# Patient Record
Sex: Male | Born: 1935 | Race: White | Hispanic: No | Marital: Married | Smoking: Former smoker
Health system: Southern US, Community
[De-identification: ages and names within clinical notes are randomized; demographics above are authoritative.]

## PROBLEM LIST (undated history)

## (undated) DIAGNOSIS — Z720 Tobacco use: Secondary | ICD-10-CM

## (undated) DIAGNOSIS — M1990 Unspecified osteoarthritis, unspecified site: Secondary | ICD-10-CM

## (undated) DIAGNOSIS — I509 Heart failure, unspecified: Secondary | ICD-10-CM

## (undated) DIAGNOSIS — F1010 Alcohol abuse, uncomplicated: Secondary | ICD-10-CM

## (undated) DIAGNOSIS — I2510 Atherosclerotic heart disease of native coronary artery without angina pectoris: Secondary | ICD-10-CM

## (undated) DIAGNOSIS — I10 Essential (primary) hypertension: Secondary | ICD-10-CM

## (undated) DIAGNOSIS — J449 Chronic obstructive pulmonary disease, unspecified: Secondary | ICD-10-CM

## (undated) DIAGNOSIS — I219 Acute myocardial infarction, unspecified: Secondary | ICD-10-CM

## (undated) HISTORY — DX: Tobacco use: Z72.0

## (undated) HISTORY — DX: Alcohol abuse, uncomplicated: F10.10

## (undated) HISTORY — PX: BACK SURGERY: SHX140

## (undated) HISTORY — DX: Acute myocardial infarction, unspecified: I21.9

## (undated) HISTORY — PX: APPENDECTOMY: SHX54

---

## 2004-03-23 DIAGNOSIS — E785 Hyperlipidemia, unspecified: Secondary | ICD-10-CM | POA: Insufficient documentation

## 2004-03-23 DIAGNOSIS — E669 Obesity, unspecified: Secondary | ICD-10-CM | POA: Insufficient documentation

## 2004-03-23 DIAGNOSIS — M5412 Radiculopathy, cervical region: Secondary | ICD-10-CM | POA: Insufficient documentation

## 2015-10-09 DIAGNOSIS — G4700 Insomnia, unspecified: Secondary | ICD-10-CM | POA: Insufficient documentation

## 2016-11-16 DIAGNOSIS — R0600 Dyspnea, unspecified: Secondary | ICD-10-CM | POA: Insufficient documentation

## 2016-12-22 DIAGNOSIS — R079 Chest pain, unspecified: Secondary | ICD-10-CM | POA: Insufficient documentation

## 2016-12-22 DIAGNOSIS — G4733 Obstructive sleep apnea (adult) (pediatric): Secondary | ICD-10-CM | POA: Insufficient documentation

## 2016-12-22 DIAGNOSIS — R29898 Other symptoms and signs involving the musculoskeletal system: Secondary | ICD-10-CM | POA: Insufficient documentation

## 2017-01-27 ENCOUNTER — Emergency Department (HOSPITAL_COMMUNITY)
Admission: EM | Admit: 2017-01-27 | Discharge: 2017-01-28 | Disposition: A | Discharge status: Home / Self Care | Payer: Medicare PPO | Attending: Emergency Medicine | Admitting: Emergency Medicine

## 2017-01-27 ENCOUNTER — Emergency Department (HOSPITAL_COMMUNITY): Payer: Medicare PPO

## 2017-01-27 ENCOUNTER — Encounter (HOSPITAL_COMMUNITY): Payer: Self-pay

## 2017-01-27 DIAGNOSIS — I10 Essential (primary) hypertension: Secondary | ICD-10-CM | POA: Insufficient documentation

## 2017-01-27 DIAGNOSIS — J449 Chronic obstructive pulmonary disease, unspecified: Secondary | ICD-10-CM | POA: Insufficient documentation

## 2017-01-27 DIAGNOSIS — W010XXA Fall on same level from slipping, tripping and stumbling without subsequent striking against object, initial encounter: Secondary | ICD-10-CM | POA: Diagnosis not present

## 2017-01-27 DIAGNOSIS — Z79899 Other long term (current) drug therapy: Secondary | ICD-10-CM | POA: Insufficient documentation

## 2017-01-27 DIAGNOSIS — S43102A Unspecified dislocation of left acromioclavicular joint, initial encounter: Secondary | ICD-10-CM | POA: Insufficient documentation

## 2017-01-27 DIAGNOSIS — Y92002 Bathroom of unspecified non-institutional (private) residence single-family (private) house as the place of occurrence of the external cause: Secondary | ICD-10-CM | POA: Insufficient documentation

## 2017-01-27 DIAGNOSIS — Z87891 Personal history of nicotine dependence: Secondary | ICD-10-CM | POA: Diagnosis not present

## 2017-01-27 DIAGNOSIS — Y939 Activity, unspecified: Secondary | ICD-10-CM | POA: Insufficient documentation

## 2017-01-27 DIAGNOSIS — Y999 Unspecified external cause status: Secondary | ICD-10-CM | POA: Diagnosis not present

## 2017-01-27 DIAGNOSIS — S4992XA Unspecified injury of left shoulder and upper arm, initial encounter: Secondary | ICD-10-CM | POA: Diagnosis present

## 2017-01-27 HISTORY — DX: Unspecified osteoarthritis, unspecified site: M19.90

## 2017-01-27 HISTORY — DX: Chronic obstructive pulmonary disease, unspecified: J44.9

## 2017-01-27 HISTORY — DX: Essential (primary) hypertension: I10

## 2017-01-27 HISTORY — DX: Atherosclerotic heart disease of native coronary artery without angina pectoris: I25.10

## 2017-01-27 MED ORDER — HYDROCODONE-ACETAMINOPHEN 5-325 MG PO TABS
1.0000 | ORAL_TABLET | Freq: Once | ORAL | Status: AC
  Administered 2017-01-27: 1 via ORAL
  Filled 2017-01-27: qty 1

## 2017-01-27 NOTE — ED Triage Notes (Signed)
Pt states he tripped on a rug and fell, landed on his left side.  Pt has pain with palpation and movement to his left shoulder.  Pt denies hitting his head or loc.

## 2017-01-27 NOTE — ED Notes (Signed)
Patient transported to X-ray 

## 2017-01-27 NOTE — ED Notes (Signed)
Patient transported back from X-ray 

## 2017-01-28 MED ORDER — HYDROCODONE-ACETAMINOPHEN 5-325 MG PO TABS: 1.0000 | ORAL_TABLET | ORAL | 0 refills | Status: DC | PRN

## 2017-01-28 NOTE — ED Provider Notes (Signed)
[facility] Provider Note   CSN: [id number] Arrival date & time: 01/27/17  2122     History   Chief Complaint Chief Complaint  Patient presents with  . Fall    shoulder pain    HPI Eddie Mitchell is a 83 y.o. male presenting for evaluation of left shoulder and clavicle pain after falling on his left side against the tub in his bathroom just prior to arrival.  He tripped over a rug, landing against the tub.  He denies head or neck injury or pain and was able to get off the floor without assistance.  His pain is constant and worsened with movement and palpation.  He denies weakness or numbness in his arms and denies cp, sob, pain with deep inspiration, lower back, hip or lower extremity pain.  His past medical history is significant for CAD and is on plavix for this condition.  HPI  Past Medical History:  Diagnosis Date  . Arthritis   . Cardiovascular disease   . COPD (chronic obstructive pulmonary disease) (HCC)   . Hypertension     There are no active problems to display for this patient.   Past Surgical History:  Procedure Laterality Date  . APPENDECTOMY    . BACK SURGERY         Home Medications    Prior to Admission medications   Medication Sig Start Date End Date Taking? Authorizing Provider  diclofenac (VOLTAREN) 50 MG EC tablet TAKE ONE TABLET TWO TIMES A DAY WITH FOOD 01/20/17  Yes Historical Provider, MD  pramipexole (MIRAPEX) 0.5 MG tablet Take by mouth. 12/24/16  Yes Historical Provider, MD  sertraline (ZOLOFT) 100 MG tablet TAKE ONE TABLET DAILY 01/20/17  Yes Historical Provider, MD  tamsulosin (FLOMAX) 0.4 MG CAPS capsule TAKE 2 CAPSULES BY MOUTH EVERY DAY 11/08/16  Yes Historical Provider, MD  HYDROcodone-acetaminophen (NORCO/VICODIN) 5-325 MG tablet Take 1 tablet by mouth every 4 (four) hours as needed. 01/28/17   Burgess Amor, PA-C  HYDROcodone-acetaminophen (NORCO/VICODIN) 5-325 MG tablet Take 1 tablet by mouth every 4 (four) hours as needed. 01/28/17    Burgess Amor, PA-C    Family History No family history on file.  Social History Social History  Substance Use Topics  . Smoking status: Former Games developer  . Smokeless tobacco: Never Used  . Alcohol use No     Allergies   Patient has no known allergies.   Review of Systems Review of Systems  HENT: Negative.   Eyes: Negative for visual disturbance.  Respiratory: Negative.   Cardiovascular: Negative.   Gastrointestinal: Negative.   Musculoskeletal: Positive for arthralgias and joint swelling. Negative for myalgias.  Skin: Negative for wound.  Neurological: Negative for dizziness, syncope, weakness, numbness and headaches.     Physical Exam Updated Vital Signs BP (!) 115/58 (BP Location: Right Arm)   Pulse (!) 59   Temp 98.1 F (36.7 C) (Oral)   Resp 20   Ht  (1.778 m)   Wt 120.7 kg   SpO2 95%   BMI 38.17 kg/m   Physical Exam  Constitutional: He appears well-developed and well-nourished.  HENT:  Head: Normocephalic and atraumatic.  Neck: Normal range of motion and full passive range of motion without pain.  Cardiovascular:  Pulses equal bilaterally  Musculoskeletal: He exhibits tenderness.       Left shoulder: He exhibits bony tenderness and deformity. He exhibits no effusion, no spasm, normal pulse and normal strength.  ttp lateral left clavicle with palpable deformity.  Anterior  shoulder, scapula and left elbow non tender.  Neurological: He is alert. He has normal strength. He displays normal reflexes. No sensory deficit.  Equal grip strength.  Skin: Skin is warm and dry.  Skin intact  Psychiatric: He has a normal mood and affect.     ED Treatments / Results  Labs (all labs ordered are listed, but only abnormal results are displayed) Labs Reviewed - No data to display  EKG  EKG Interpretation None       Radiology Dg Shoulder Left  Result Date: 01/27/2017 CLINICAL DATA:  Trip and fall over a rug, left shoulder pain. EXAM: LEFT SHOULDER - 2+  VIEW COMPARISON:  None. FINDINGS: Acromioclavicular joint widening of at least 9 mm with superior displacement of distal clavicle with respect to the acromion. Widening of the coracoclavicular distance of 3.2 cm. No definite acute fracture, there is spurring of the acromion. Glenohumeral alignment is maintained. IMPRESSION: Acromioclavicular joint separation. Widening of the coracoclavicular distance. Findings consistent with at least type 3 (or higher) AC joint separation. No evidence of acute fracture. Electronically Signed   By: Rubye Oaks M.D.   On: 01/27/2017 22:11    Procedures Procedures (including critical care time)  Medications Ordered in ED Medications  HYDROcodone-acetaminophen (NORCO/VICODIN) 5-325 MG per tablet 1 tablet (1 tablet Oral Given 01/27/17 2330)     Initial Impression / Assessment and Plan / ED Course  I have reviewed the triage vital signs and the nursing notes.  Pertinent labs & imaging results that were available during my care of the patient were reviewed by me and considered in my medical decision making (see chart for details).     Pt with significant AC separation, no other injury. He was placed in a sling with swath. Hydrocodone prescribed. Plan f/u with ortho, referral given.    Pt was seen by Dr. Rubin Payor prior to discharge.  Final Clinical Impressions(s) / ED Diagnoses   Final diagnoses:  AC separation, left, initial encounter    New Prescriptions Discharge Medication List as of 01/28/2017 12:31 AM    START taking these medications   Details  HYDROcodone-acetaminophen (NORCO/VICODIN) 5-325 MG tablet Take 1 tablet by mouth every 4 (four) hours as needed., Starting Fri 01/28/2017, Print         Burgess Amor, PA-C 01/28/17 1741    Benjiman Core, MD 01/29/17 1154

## 2017-01-28 NOTE — Discharge Instructions (Signed)
Wear the shoulder sling at all times except when bathing.  You may take the hydrocodone prescribed for pain relief.  This will make you drowsy - do not drive within 4 hours of taking this medication.

## 2017-01-31 ENCOUNTER — Encounter: Payer: Self-pay | Admitting: Orthopedic Surgery

## 2017-01-31 ENCOUNTER — Ambulatory Visit (INDEPENDENT_AMBULATORY_CARE_PROVIDER_SITE_OTHER): Payer: Medicare PPO | Admitting: Orthopedic Surgery

## 2017-01-31 VITALS — BP 142/58 | HR 75 | Wt 270.0 lb

## 2017-01-31 DIAGNOSIS — S43102A Unspecified dislocation of left acromioclavicular joint, initial encounter: Secondary | ICD-10-CM

## 2017-01-31 MED FILL — Hydrocodone-Acetaminophen Tab 5-325 MG: ORAL | Qty: 6 | Status: AC

## 2017-01-31 NOTE — Progress Notes (Signed)
New patient   Chief Complaint  Patient presents with  . Shoulder Injury    LEFT SHOULDER INJURY, DOI 01/27/17   History 83 year old male fell in his house in in the bathroom injured his left shoulder presented to the ER the 19th 5 days ago complaining of left shoulder pain  Summation he is 83 years old his nondominant left shoulder was injured. He complains of 5 day history of severe pain and deformity and dull aching sensation of the left shoulder pain distal clavicle  HPI  Review of Systems  Constitutional: Negative for weight loss.  Cardiovascular: Negative for chest pain.  Neurological: Positive for tingling. Negative for focal weakness and weakness.       Lower extremity left worse than right chronic   Past Medical History:  Diagnosis Date  . Arthritis   . Cardiovascular disease   . COPD (chronic obstructive pulmonary disease) (HCC)   . Hypertension     BP (!) 142/58   Pulse 75   Wt 270 lb (122.5 kg)   BMI 38.74 kg/m   Okay constitutional symptoms continued exam shows normal development and nutrition grooming and hygiene he does have deformity left distal clavicle with prominence  Referral vascular system is observe no swelling or varicose veins in his upper extremities and his upper extremities had normal radial pulses in each arm. His left upper extremity was warm to touch there was no significant edema  His left axilla and supraclavicular region have no palpable lymph nodes  He was able to ambulate normally  His left shoulder showed prominence of the distal clavicle ecchymosis the skin over the left shoulder and arm with painful passive range of motion in the left shoulder limited by pain to 15 globally. The distal clavicle was unstable to shoulder joint was reduced by shown on x-ray was not tested for subluxation muscle tone was normal  Coordination tests were deferred as were deep tendon reflexes in the upper extremity normal sensation was noted in the left and  right hand he was oriented to time person place mood and affect were normal  The x-ray was done at the hospital it shows a type III distal clavicle separation  Encounter Diagnosis  Name Primary?  . Closed dislocation of left acromioclavicular joint, initial encounter Yes     Recommended treatment for 83 year old male nondominant with no skin threatening his sling and comfort measures  Return 1 week recheck for any neurovascular compromise and then start exercises

## 2017-01-31 NOTE — Patient Instructions (Signed)
AC JOINT SEPARATION TYPE 3

## 2017-02-07 ENCOUNTER — Encounter: Payer: Self-pay | Admitting: Orthopedic Surgery

## 2017-02-07 ENCOUNTER — Ambulatory Visit (INDEPENDENT_AMBULATORY_CARE_PROVIDER_SITE_OTHER): Payer: Medicare PPO | Admitting: Orthopedic Surgery

## 2017-02-07 DIAGNOSIS — S43102A Unspecified dislocation of left acromioclavicular joint, initial encounter: Secondary | ICD-10-CM | POA: Diagnosis not present

## 2017-02-07 NOTE — Progress Notes (Signed)
Progress Note   Patient ID: Eddie Mitchell, male   DOB: [birth year], 83 y.o.   MRN: [id number]  Chief Complaint  Patient presents with  . Follow-up    left ac joint separation, DOI 01/27/17    HPI Eddie Mitchell is a 83 y.o. male.   HPI 83 year old male type 3+ acromio clavicular separation. Complains of periscapular pain Review of Systems Review of Systems Normal neuro  Denies fever  Examination There were no vitals taken for this visit.  Gen. appearance the patient's appearance is normal with normal grooming and  hygiene The patient is oriented to person place and time Mood and affect are normal   Ortho Exam Motor exam 5/5 manual muscle testing , right hand with normal nerve function  Skin is normal (no rash or erythema)    Medical decision-making Diagnosis, Data, Plan (risk)  Encounter Diagnosis  Name Primary?  . Closed dislocation of left acromioclavicular joint, initial encounter Yes    X-ray left clavicle in 2 weeks continue sling  After x-ray in 2 weeks start exercises at home  Fuller Canada, MD 02/07/2017 3:48 PM

## 2017-02-21 ENCOUNTER — Ambulatory Visit (INDEPENDENT_AMBULATORY_CARE_PROVIDER_SITE_OTHER): Payer: Medicare PPO

## 2017-02-21 ENCOUNTER — Encounter: Payer: Self-pay | Admitting: Orthopedic Surgery

## 2017-02-21 ENCOUNTER — Ambulatory Visit (INDEPENDENT_AMBULATORY_CARE_PROVIDER_SITE_OTHER): Payer: Medicare PPO | Admitting: Orthopedic Surgery

## 2017-02-21 DIAGNOSIS — S43102D Unspecified dislocation of left acromioclavicular joint, subsequent encounter: Secondary | ICD-10-CM

## 2017-02-21 NOTE — Progress Notes (Signed)
Progress Note   Patient ID: Eddie Mitchell, male   DOB: Mar 22, 1934, 83 y.o.   MRN: [id number]  Chief Complaint  Patient presents with  . Follow-up    Recheck on left clavicle fracture, DOI 01-27-17.    HPI Eddie ArnHarold Mitchell is a 83 y.o. male.   HPI  83 year old male type III before meals separation left shoulder here for x-rays. He says he is doing much better and is like a new man Review of Systems Review of Systems Normal neuro  Denies fever  Examination There were no vitals taken for this visit.  Gen. appearance the patient's appearance is normal with normal grooming and  hygiene The patient is oriented to person place and time Mood and affect are normal   Ortho Exam Stability tests are normal  Motor exam 5/5 manual muscle testing , no atrophy  Skin is normal (no rash or erythema) prominent left distal clavicle with some motion noted at the separation  Neurovascular exam remains intact Medical decision-making Diagnosis, Data, Plan (risk)  Encounter Diagnosis  Name Primary?  . Closed dislocation of left acromioclavicular joint, subsequent encounter Yes    The x-ray shows a type III distal clavicle Acromioclavicular separation  Recommend exercises and recheck 6 weeks   Fuller CanadaStanley Leonard Feigel, MD 02/21/2017 3:31 PM

## 2017-02-21 NOTE — Patient Instructions (Addendum)

## 2017-04-04 ENCOUNTER — Encounter: Payer: Self-pay | Admitting: Orthopedic Surgery

## 2017-04-04 ENCOUNTER — Ambulatory Visit (INDEPENDENT_AMBULATORY_CARE_PROVIDER_SITE_OTHER): Payer: Medicare PPO | Admitting: Orthopedic Surgery

## 2017-04-04 DIAGNOSIS — S43102D Unspecified dislocation of left acromioclavicular joint, subsequent encounter: Secondary | ICD-10-CM

## 2017-04-04 NOTE — Progress Notes (Signed)
[facility]  Chief Complaint  Patient presents with  . Follow-up    Recheck on left AC seperation, DOI 01-27-17.    Patient complains of some paresthesias in the left upper extremity down to his elbow. He has had chronic paresthesias down to the index and long finger for several years. He notices an increase in tingling in electric-like shock sensation left upper extremity down to but not below the elbow  His distal clavicle is slightly tender to palpation in his abduction is 85 his flexion is 140  Hand normal sensation and pulse  Traction paresthesias are noted. I told him if it becomes constant or goes below the elbow then we need to do further imaging.  Recommend active range of motion exercises follow-up if any progression of the paresthesias

## 2017-06-26 IMAGING — DX DG SHOULDER 2+V*L*
2 series · 2 of 2 positions shown · non-contrast
Comparison: None.

CLINICAL DATA: Trip and fall over a rug, left shoulder pain.

EXAM:
LEFT SHOULDER - 2+ VIEW

[shoulder grashey]
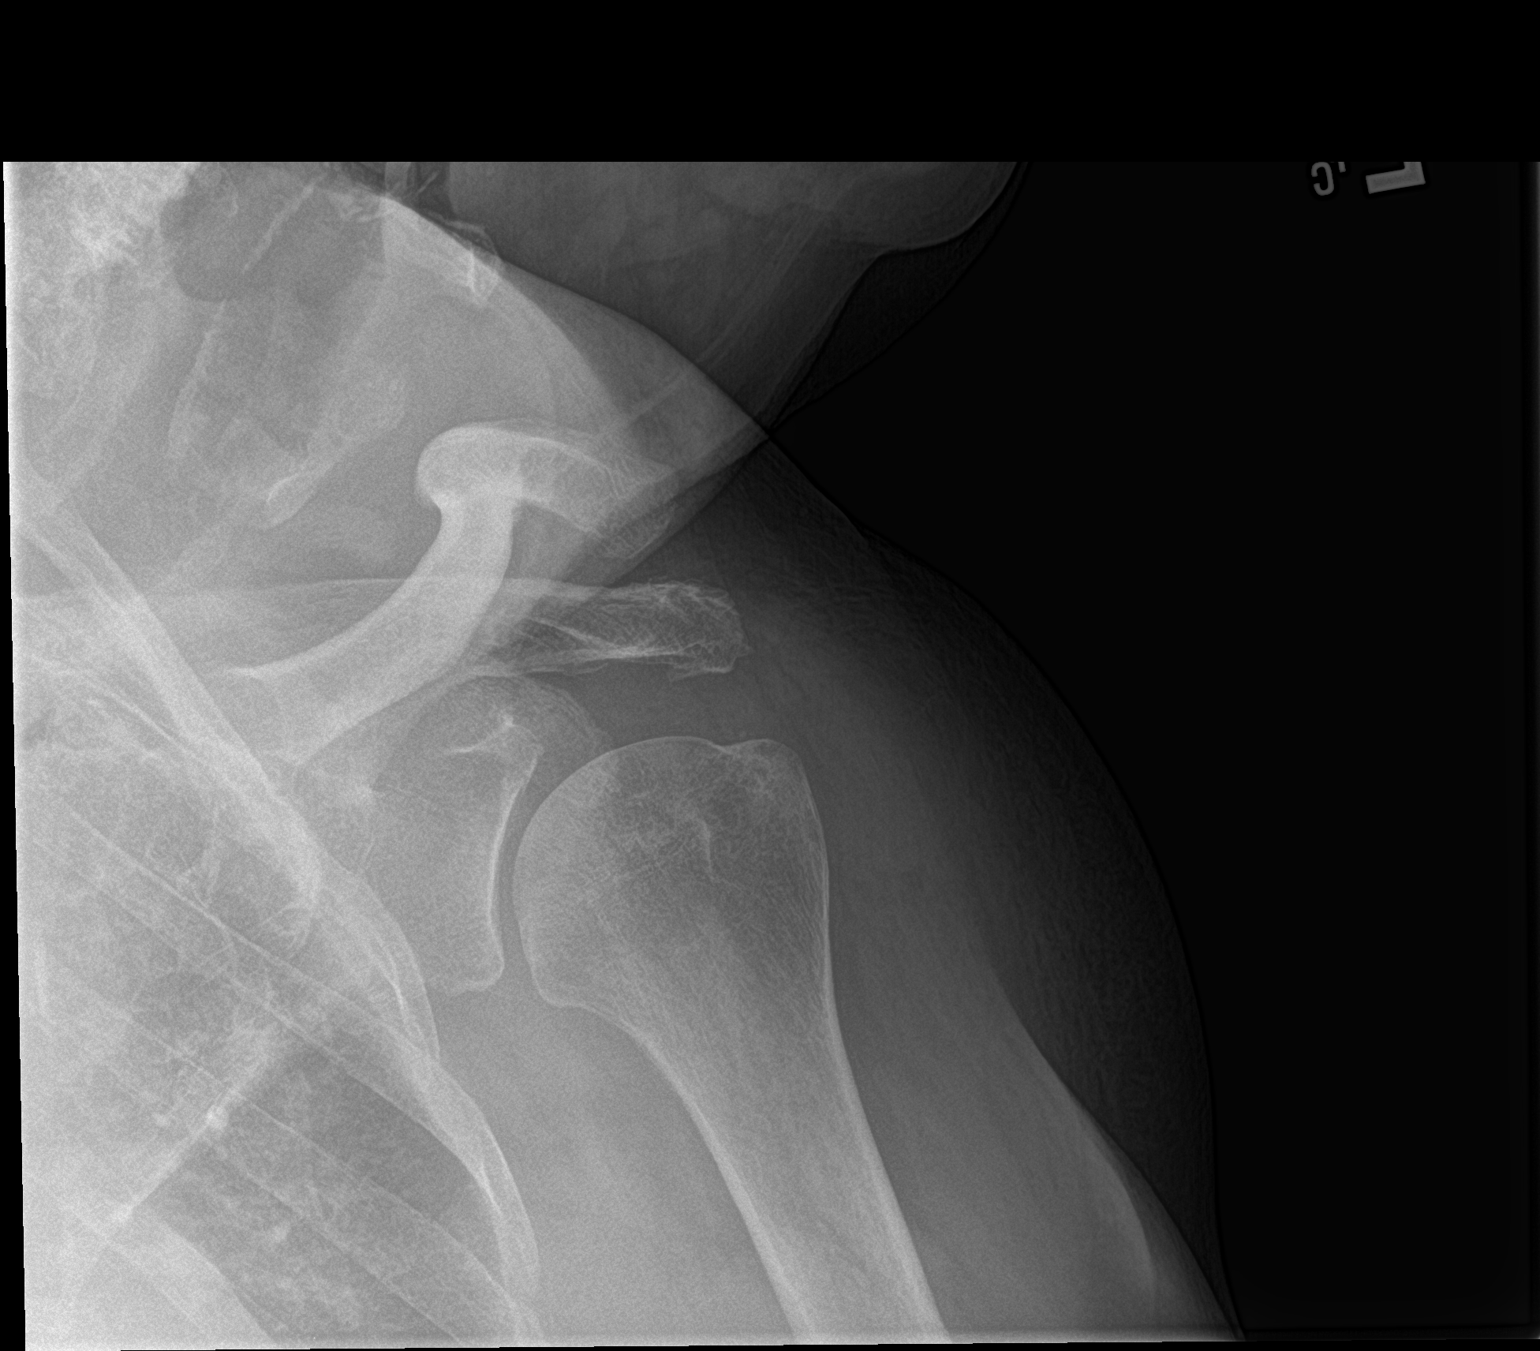

[shoulder y view]
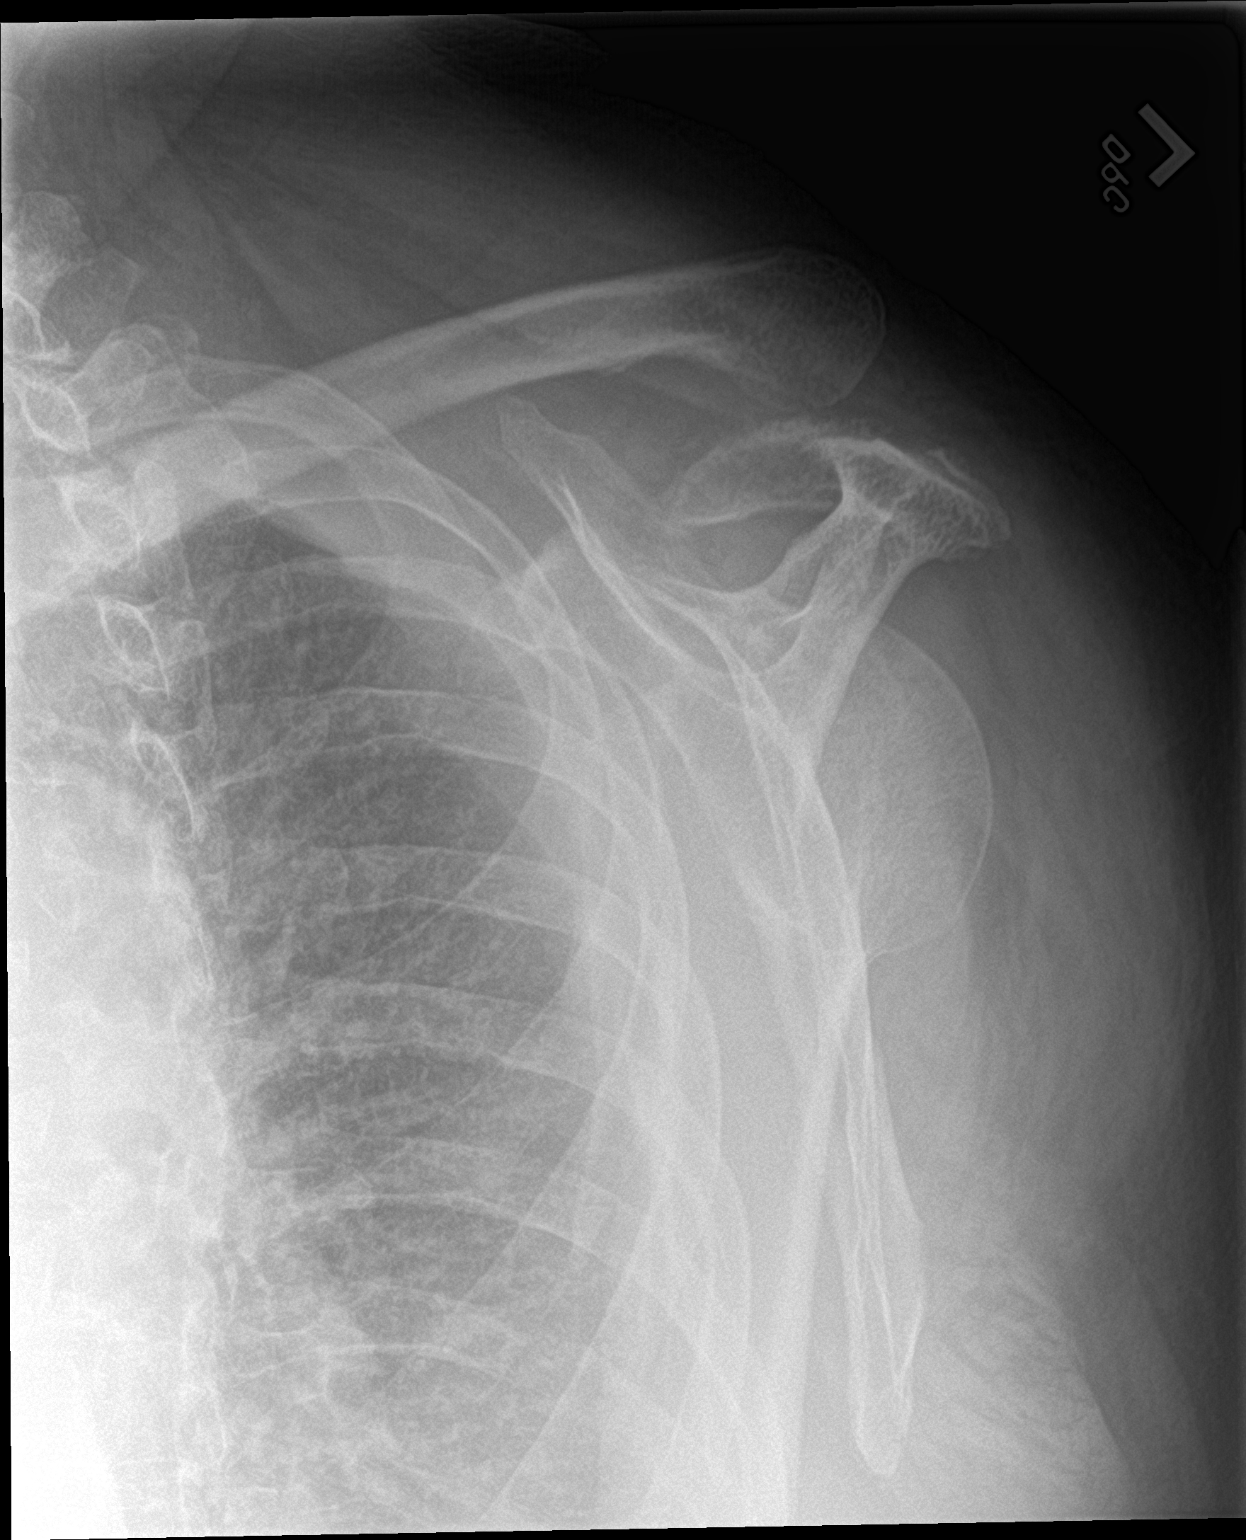

[2 of 2 positions shown; findings below may reference images not displayed]

FINDINGS: Acromioclavicular joint widening of at least 9 mm with superior
displacement of distal clavicle with respect to the acromion.
Widening of the coracoclavicular distance of 3.2 cm. No definite
acute fracture, there is spurring of the acromion. Glenohumeral
alignment is maintained.
IMPRESSION: Acromioclavicular joint separation. Widening of the coracoclavicular
distance. Findings consistent with at least type 3 (or higher) AC
joint separation.

No evidence of acute fracture.

## 2017-10-06 DIAGNOSIS — R609 Edema, unspecified: Secondary | ICD-10-CM | POA: Insufficient documentation

## 2017-10-06 DIAGNOSIS — I4891 Unspecified atrial fibrillation: Secondary | ICD-10-CM | POA: Insufficient documentation

## 2017-10-06 DIAGNOSIS — R600 Localized edema: Secondary | ICD-10-CM | POA: Insufficient documentation

## 2017-10-26 ENCOUNTER — Telehealth: Payer: Self-pay | Admitting: Orthopedic Surgery

## 2017-10-26 NOTE — Telephone Encounter (Signed)
Left message. Dr Romeo AppleHarrison did not order any PT for him. Asked them to call me back if the orders that need signed are old ones, we have not seen patient greater than 6 months.

## 2017-10-26 NOTE — Telephone Encounter (Signed)
[place] health PT [place] not come into [place] with [place] orders from Pulmonology nurse practitioner  Home health wants to know if you will sign off on the Skilled nursing / and lab orders  CBC and CMP   Can you co sign her orders?  [place]CP is in [place] also and can not co sign.

## 2017-10-26 NOTE — Telephone Encounter (Signed)
I have discussed with Dr Romeo AppleHarrison, he can not cosign pulmonology home PT orders. I called home health to advise. Spoke to [facility] / she has voiced understanding.

## 2017-10-26 NOTE — Telephone Encounter (Signed)
UNFORTUNATELY I CANT TAKE ON THAT RISK

## 2017-10-26 NOTE — Telephone Encounter (Signed)
Call received via voice message from [facility] [place] - PH# [phone], with question/request for Dr Romeo AppleHarrison to co-sign on orders? (Does not appear that Dr Romeo AppleHarrison is ordering [facility])   I've left a message in return to relay that I have entered a telephone note, response to follow; I also provided our fax# if they wish to fax their request.

## 2017-10-28 ENCOUNTER — Other Ambulatory Visit: Payer: Self-pay

## 2017-10-28 ENCOUNTER — Ambulatory Visit: Payer: Medicare PPO | Admitting: Family Medicine

## 2017-10-28 ENCOUNTER — Telehealth: Payer: Self-pay | Admitting: Family Medicine

## 2017-10-28 ENCOUNTER — Encounter: Payer: Self-pay | Admitting: Family Medicine

## 2017-10-28 VITALS — BP 142/68 | HR 64 | Temp 98.1°F | Resp 16 | Ht 70.0 in | Wt 280.2 lb

## 2017-10-28 DIAGNOSIS — R600 Localized edema: Secondary | ICD-10-CM

## 2017-10-28 DIAGNOSIS — R0602 Shortness of breath: Secondary | ICD-10-CM | POA: Diagnosis not present

## 2017-10-28 DIAGNOSIS — J440 Chronic obstructive pulmonary disease with acute lower respiratory infection: Secondary | ICD-10-CM | POA: Diagnosis not present

## 2017-10-28 DIAGNOSIS — I480 Paroxysmal atrial fibrillation: Secondary | ICD-10-CM | POA: Diagnosis not present

## 2017-10-28 DIAGNOSIS — Z79899 Other long term (current) drug therapy: Secondary | ICD-10-CM | POA: Diagnosis not present

## 2017-10-28 DIAGNOSIS — G2581 Restless legs syndrome: Secondary | ICD-10-CM

## 2017-10-28 DIAGNOSIS — J209 Acute bronchitis, unspecified: Secondary | ICD-10-CM

## 2017-10-28 LAB — BASIC METABOLIC PANEL
BUN/Creatinine Ratio: 17 (calc) (ref 6–22)
BUN: 21 mg/dL (ref 7–25)
CHLORIDE: 100 mmol/L (ref 98–110)
CO2: 31 mmol/L (ref 20–32)
Calcium: 9.8 mg/dL (ref 8.6–10.3)
Creat: 1.23 mg/dL — ABNORMAL HIGH (ref 0.70–1.11)
GLUCOSE: 106 mg/dL (ref 65–139)
Potassium: 4.4 mmol/L (ref 3.5–5.3)
Sodium: 139 mmol/L (ref 135–146)

## 2017-10-28 MED ORDER — DOXYCYCLINE HYCLATE 100 MG PO TABS
100.0000 mg | ORAL_TABLET | Freq: Two times a day (BID) | ORAL | 0 refills | Status: DC
Start: 2017-10-28 — End: 2017-11-25

## 2017-10-28 MED ORDER — ALBUTEROL SULFATE (2.5 MG/3ML) 0.083% IN NEBU
2.5000 mg | INHALATION_SOLUTION | Freq: Once | RESPIRATORY_TRACT | Status: AC
  Administered 2017-10-28: 2.5 mg via RESPIRATORY_TRACT

## 2017-10-28 MED ORDER — IPRATROPIUM BROMIDE 0.02 % IN SOLN
0.5000 mg | Freq: Once | RESPIRATORY_TRACT | Status: AC
  Administered 2017-10-28: 0.5 mg via RESPIRATORY_TRACT

## 2017-10-28 MED ORDER — PRAMIPEXOLE DIHYDROCHLORIDE 0.5 MG PO TABS: 1.0000 mg | ORAL_TABLET | Freq: Every day | ORAL | 0 refills | Status: DC

## 2017-10-28 NOTE — Telephone Encounter (Signed)
Please call patient's cardiologist, Dr. Catha GosselinBanerjee, and advised that patient was seen in our office on Friday afternoon.  Please advised that he is reportedly coming to their office today at 3 PM for Doppler of his lower extremities.  I am very concerned about his cardiac situation and request that he be seen by his regular cardiologist in the office today if at all possible.  He was very symptomatic with his breathing, or worsened lower extremity edema, and orthopnea at his visit on Friday.  I do believe this is a situation which needs to be addressed by the provider who knows him best.  Please find out if patient can be seen for an appointment and contact patient with this information.

## 2017-10-28 NOTE — Progress Notes (Signed)
Patient ID: Eddie Mitchell, male    DOB: [birth year], 84 y.o.   MRN: [id number]  Chief Complaint  Patient presents with  . Hypertension  . Establish Care    Allergies Patient has no known allergies.  Subjective:   Eddie Mitchell is a 84 y.o. male who presents to [facility] today.  HPI Eddie Mitchell presents today as a new patient visit to establish care.  He presents today with his wife.  They reside in [place], but have received much of their medical care in [place].  He has reportedly been seen by primary care physician in [place], Dr. Tanda Rockers.  In addition, he has been followed by Dr. Leavy Cella and pulmonology and Dr.Benerjee in cardiology.  All of these doctors in [place].  He has however been seen by Dr. Romeo Apple in orthopedics in [place] secondary to a fall in April 2018.  He reports that he had been in his usual state of health until several months ago.  He reports that he developed edema in his lower extremities and dyspnea on exertion and shortness of breath about 2 months ago.  He was seen by cardiology at that time and was found to be in atrial fibrillation.  At that time he was switched from Plavix to Eliquis.  He continued to have worsening swelling in his legs and went back to cardiology.  He was seen by Eddie Mitchell, a nurse practitioner in the cardiology practice on October 24, 2017.  At that time he was taken off his Lasix and placed on Demadex 1 mg p.o. daily.  He has been taking this medication since October 25, 2017.  At that time his cardiologist office told him that he needed home health services to come to his house to check blood work twice a week to make sure that his potassium remained within normal limits.  These home health services were attempted to be set up.  However, patient was told that a physician in [place] could not write for home health services in [place].  Therefore, patient presents today with complaints of swelling  in his legs, dyspnea on exertion, shortness of breath, and a request for home health services twice a week to check blood work.  He reports that he has a history of COPD and tobacco abuse.  He has been prescribed Symbicort to use 2 puffs twice a day.  In addition he has albuterol nebulizer at home.  He reports that when he walks short distances he feels very winded and has trouble breathing.  He reports that over the past 1-2 months he has had increased cough with sputum production.  He reports his cough is yellow-green in color.  He also reports that he has been having to sleep with multiple pillows under him or sleep in a recliner because he can no longer lie flat in his bed.  He denies any chest pain or palpitations.  He reports he was never even able to tell when he was in atrial fibrillation.  He reports he is to have a Doppler scan of his lower legs done on Monday at the cardiologist office to make sure he does not have a deep venous thrombosis.  He has never had a blood clot before.  He reports that since starting the new fluid pill that he has lost some weight and his legs are a little bit better but they are still very swollen.  He reports that prior to 2 months ago  he did not have any baseline swelling in his legs.  He reports that several weeks ago his legs were so swollen that they were weeping fluid.  He has no lesions or open wounds on his legs.  He reports that his energy is low.  His appetite is okay.  Bowel movements are normal.  No abdominal pain.  He denies any fevers, chills, nausea, vomiting, diarrhea.  He reports that he has not been compliant with his Symbicort inhaler.  He does use the albuterol from time to time.  He is not been treated for pneumonia or any recent chest infections.  He has not had a recent chest x-ray.  He reports that he does occasionally cough up sputum which is usually clear but over the past couple months sputum is changed in color and he has had increased sputum  production.    Past Medical History:  Diagnosis Date  . Alcohol abuse   . AMI (acute myocardial infarction) (HCC)    s/p 2 stents. Dr. Mervin HackBenerjee  . Arthritis   . Cardiovascular disease   . COPD (chronic obstructive pulmonary disease) (HCC)   . Hypertension   . Tobacco abuse    quit in 1992; smoked cigarettes for 50 years/ Marlboro Lights, 1/2 ppd or less    Past Surgical History:  Procedure Laterality Date  . APPENDECTOMY    . BACK SURGERY      No family history on file.   Social History   Socioeconomic History  . Marital status: Married    Spouse name: None  . Number of children: None  . Years of education: None  . Highest education level: None  Social Needs  . Financial resource strain: None  . Food insecurity - worry: None  . Food insecurity - inability: None  . Transportation needs - medical: None  . Transportation needs - non-medical: None  Occupational History  . None  Tobacco Use  . Smoking status: Former Games developermoker  . Smokeless tobacco: Never Used  Substance and Sexual Activity  . Alcohol use: No  . Drug use: No  . Sexual activity: Yes  Other Topics Concern  . None  Social History Narrative   Married for 60 year and wife died. Has four children (3 girls and 1 boy).   Married second time for one year.    Eats all foods.   Drive.   Wear seatbelt.   Retired for over 20 years.   Worked at [employer].    Current Outpatient Medications on File Prior to Visit  Medication Sig Dispense Refill  . apixaban (ELIQUIS) 5 MG TABS tablet Eliquis 5 mg tablet  Take 1 tablet twice a day by oral route for 90 days.    Marland Kitchen. aspirin EC 81 MG tablet Take 81 mg by mouth daily.    Marland Kitchen. atorvastatin (LIPITOR) 40 MG tablet Take 40 mg by mouth daily.    . budesonide-formoterol (SYMBICORT) 160-4.5 MCG/ACT inhaler Inhale into the lungs.    . bumetanide (BUMEX) 1 MG tablet bumetanide 1 mg tablet  Take 1 tablet twice a day by oral route for 90 days.    Marland Kitchen. diltiazem (CARTIA XT) 180 MG 24 hr  capsule diltiazem CD 180 mg capsule,extended release 24 hr  Take 1 capsule every day by oral route for 90 days.    Marland Kitchen. lisinopril (PRINIVIL,ZESTRIL) 10 MG tablet Take 10 mg by mouth daily.    . metoprolol tartrate (LOPRESSOR) 25 MG tablet Take 25 mg by mouth 2 (two) times  daily.    . nitroGLYCERIN (NITROSTAT) 0.4 MG SL tablet Place 0.4 mg under the tongue every 5 (five) minutes as needed for chest pain.     No current facility-administered medications on file prior to visit.     Review of Systems  Constitutional: Positive for fatigue and unexpected weight change. Negative for chills and diaphoresis.       Reports that he has gained over 10 pounds over the past month.  HENT: Negative for facial swelling, nosebleeds, trouble swallowing and voice change.   Respiratory: Positive for cough, chest tightness, shortness of breath and wheezing. Negative for stridor.   Cardiovascular: Positive for leg swelling. Negative for chest pain and palpitations.  Gastrointestinal: Negative for abdominal distention, abdominal pain, diarrhea and vomiting.  Endocrine: Negative for polyphagia and polyuria.  Genitourinary: Negative for decreased urine volume, dysuria, hematuria and urgency.  Musculoskeletal: Negative for joint swelling.  Neurological: Negative for dizziness, tremors, syncope, facial asymmetry, speech difficulty, weakness, light-headedness and headaches.  Hematological: Negative for adenopathy. Does not bruise/bleed easily.  Psychiatric/Behavioral: Negative for agitation and confusion.     Objective:   BP (!) 142/68 (BP Location: Left Arm, Patient Position: Sitting, Cuff Size: Normal)   Pulse 64   Temp 98.1 F (36.7 C) (Temporal)   Resp 16   Ht 5\' 10"  (1.778 m)   Wt 280 lb 4 oz (127.1 kg)   SpO2 98%   BMI 40.21 kg/m   Physical Exam  Constitutional: He is oriented to person, place, and time.  HENT:  Head: Normocephalic and atraumatic.  Eyes: Conjunctivae and EOM are normal. Pupils are  equal, round, and reactive to light.  Neck: Normal range of motion. Neck supple. No tracheal deviation present. No thyromegaly present.  Cardiovascular: Normal rate and regular rhythm.  Pulmonary/Chest:  Diffuse wheezes and upper and lower lung fields bilaterally.  Status post ipratropium/albuterol nebulizer therapy decreased wheezes throughout all lung fields.  Patient with symptomatic improvement in his breathing after nebulizer therapy.  Abdominal: Soft. Bowel sounds are normal.  Diffusely obese  Neurological: He is alert and oriented to person, place, and time. No cranial nerve deficit.  Skin:  Extremities with 3+ pitting edema.  No skin breakdown.  Evidence of venous stasis bilaterally with slightly erythematous skin.  However no signs of cellulitis.  No cutaneous temperature difference bilaterally.  No weeping or open skin wounds.  Psychiatric: He has a normal mood and affect. His behavior is normal. Judgment and thought content normal.     Assessment and Plan   Elderly male with multiple medical problems presenting as a new patient today with 1-28-month history of new onset edema, dyspnea on exertion, atrial fibrillation with known chronic obstructive pulmonary disease and coronary artery disease.   Discussed with patient and his wife today that his current situation was very concerning for a new problem with his heart.  I discussed with them that my concern is what predisposed him to go into atrial fibrillation and have this new onset edema and dyspnea on exertion.  I am concerned that he could be an a CHF exacerbation due to a new decrease in cardiac function.  I do believe that he needs an echo for further evaluation of his cardiac status  I wanted patient to go for chest x-ray today to make sure that he was not in pulmonary edema.  However he is unable to go get a chest x-ray today because they have some issues with transportation.  He does have a history of  COPD with a significant  tobacco history.  He is not been compliant with his maintenance inhaler use.  In addition I believe he has an acute exacerbation of COPD at this time due to his increased sputum production, change in sputum color, and increased shortness of breath.  I do recommend treatment with antibiotics at this time.  In addition we will give him nebulizer ipratropium solution to use 3 times a day at home.  He requested a refill of his albuterol nebulizer solution.  I do not recommend he use this frequently due to its propensity to increase his risk of atrial fibrillation.  We did discuss that he needs to restart his long acting beta agonist/inhaled corticosteroid.  We discussed the fact that he has to use this medicine twice a day every single day in order for the medication to work.  He voiced understanding and agrees to start this medication tonight.  I will not give him oral steroids to help with his wheezing/COPD exacerbation because of his tenuous fluid situation at this time.  I believe that the steroids could cause fluid retention which could worsen his edema and possible pulmonary edema at this time.  I have asked him to please check his potassium today at the lab.  I have ordered it stat and will let him know the results.  We will find out if there is any modification his potassium needed.  In addition I have asked him that if his symptoms worsen that he should go to the emergency department at [facility] for evaluation.  He voiced agreement and understanding of this.  He reports he will keep his scheduled appointment with cardiology at 3 PM on Monday.  We did attempt to call the cardiologist office today however they closed at noon.  We will call the cardiology office Monday and see if they can see patient as a work in visit to get some resolution as to the cause of his symptoms.  I did tell Mr. Nakama and his wife that I was not comfortable with writing an order for home health services.  I do believe  this is an acute exacerbation of both his lung and his heart.  I believe that this needs to be followed very closely by his medical doctors/cardiologist.  They voiced understanding of this and reports that he will follow-up on Monday with his cardiologist.  1. Bilateral lower extremity edema - Basic Metabolic Panel (BMET) 2. Paroxysmal atrial fibrillation (HCC) Continue Eliquis as directed. 3. High risk medication use - Basic Metabolic Panel (BMET) 4. COPD with acute bronchitis (HCC) - doxycycline (VIBRA-TABS) 100 MG tablet; Take 1 tablet (100 mg total) by mouth 2 (two) times daily.  Dispense: 20 tablet; Refill: 0 - DG Chest 2 View; Future - albuterol (PROVENTIL) (2.5 MG/3ML) 0.083% nebulizer solution 2.5 mg - ipratropium (ATROVENT) nebulizer solution 0.5 mg - Basic Metabolic Panel (BMET)  5. Restless leg syndrome Patient counseled in detail regarding the risks of medication. Told to call or return to clinic if develop any worrisome signs or symptoms. Patient voiced understanding.   - pramipexole (MIRAPEX) 0.5 MG tablet; Take 2 tablets (1 mg total) by mouth at bedtime.  Dispense: 30 tablet; Refill: 0    Prior to completion of dictation of this note patient's BMP was back.  I called and spoke with his wife via the telephone.  I relayed to them that his potassium was 4.2 and within normal limits.  She has agreed that he will have  this checked next week at the cardiologist office or at our office.  In addition, she requested a refill of his Requip while I was on the phone with her.  She reports that they forgot to ask for this while they were at the office today.  She reports that Dr. Tanda Rockers had placed him on the 0.5 mg tablets, 2 p.o. at bedtime.  She reports she has been on this dose for quite some time with no side effects.  She reports that it helps greatly with his restless leg syndrome.  I sent a refill of this medication and.   Return in about 1 week (around 11/04/2017), or if symptoms  worsen or fail to improve, for Follow-up. Aliene Beams, MD 10/28/2017

## 2017-10-28 NOTE — Patient Instructions (Signed)
Start the ipratropium nebulizer three times a day as needed for breathing. Start the antibiotic as directed, doxycycline 100 mg, 2 times a day Make sure you are using the symbiocort as directed twice a day  Do not use the albuterol nebulizer solution unless really need it for breathing since you are getting the long acting albuterol in the inhaler.   Checking potassium today Continue fluid pills.  Keep appointment with cardiology on Monday at 3pm  If your breathing worsens or you develop any chest pain or shortness of breath -go to the ED.

## 2017-10-31 NOTE — Telephone Encounter (Signed)
Spoke to Eddie Mitchell at Dr. Cristopher EstimableBanerjee's office. She states that Dr. Catha GosselinBanerjee does not have any openings today, but she thinks they can work him while he is there. She is sending a note to his nurse to see what they can do.

## 2017-11-03 ENCOUNTER — Telehealth: Payer: Self-pay | Admitting: Family Medicine

## 2017-11-03 NOTE — Telephone Encounter (Signed)
Per Dr. Tracie HarrierHagler, called Dr. Cristopher EstimableBanerjee's office and patient to see if he was able to see Dr. Catha GosselinBanerjee on Friday last week. I left message with patient to return call. I spoke with Verlon AuLeslie at Dr. Cristopher EstimableBanerjee's office, she states patient did not make it appointment on Friday. She states he called in stating he was sick and throwing up.

## 2017-11-04 ENCOUNTER — Emergency Department (HOSPITAL_COMMUNITY)
Admission: EM | Admit: 2017-11-04 | Discharge: 2017-11-04 | Disposition: A | Discharge status: Home / Self Care | Payer: Medicare PPO | Attending: Emergency Medicine | Admitting: Emergency Medicine

## 2017-11-04 ENCOUNTER — Other Ambulatory Visit: Payer: Self-pay

## 2017-11-04 ENCOUNTER — Emergency Department (HOSPITAL_COMMUNITY): Payer: Medicare PPO

## 2017-11-04 ENCOUNTER — Encounter (HOSPITAL_COMMUNITY): Payer: Self-pay | Admitting: Emergency Medicine

## 2017-11-04 DIAGNOSIS — Z79899 Other long term (current) drug therapy: Secondary | ICD-10-CM | POA: Insufficient documentation

## 2017-11-04 DIAGNOSIS — Z7901 Long term (current) use of anticoagulants: Secondary | ICD-10-CM | POA: Diagnosis not present

## 2017-11-04 DIAGNOSIS — R0600 Dyspnea, unspecified: Secondary | ICD-10-CM | POA: Diagnosis not present

## 2017-11-04 DIAGNOSIS — Z87891 Personal history of nicotine dependence: Secondary | ICD-10-CM | POA: Insufficient documentation

## 2017-11-04 DIAGNOSIS — Z7982 Long term (current) use of aspirin: Secondary | ICD-10-CM | POA: Diagnosis not present

## 2017-11-04 DIAGNOSIS — J441 Chronic obstructive pulmonary disease with (acute) exacerbation: Secondary | ICD-10-CM | POA: Diagnosis not present

## 2017-11-04 DIAGNOSIS — I10 Essential (primary) hypertension: Secondary | ICD-10-CM | POA: Insufficient documentation

## 2017-11-04 DIAGNOSIS — I509 Heart failure, unspecified: Secondary | ICD-10-CM | POA: Diagnosis not present

## 2017-11-04 DIAGNOSIS — R0602 Shortness of breath: Secondary | ICD-10-CM | POA: Diagnosis present

## 2017-11-04 HISTORY — DX: Heart failure, unspecified: I50.9

## 2017-11-04 LAB — BASIC METABOLIC PANEL
ANION GAP: 12 (ref 5–15)
BUN: 21 mg/dL — ABNORMAL HIGH (ref 6–20)
CALCIUM: 9.6 mg/dL (ref 8.9–10.3)
CO2: 29 mmol/L (ref 22–32)
Chloride: 102 mmol/L (ref 101–111)
Creatinine, Ser: 1.13 mg/dL (ref 0.61–1.24)
GFR calc non Af Amer: 59 mL/min — ABNORMAL LOW (ref >60)
Glucose, Bld: 124 mg/dL — ABNORMAL HIGH (ref 65–99)
Potassium: 3.5 mmol/L (ref 3.5–5.1)
Sodium: 143 mmol/L (ref 135–145)

## 2017-11-04 LAB — RESPIRATORY PANEL BY PCR
ADENOVIRUS-RVPPCR: NOT DETECTED
Bordetella pertussis: NOT DETECTED
CORONAVIRUS 229E-RVPPCR: NOT DETECTED
CORONAVIRUS HKU1-RVPPCR: NOT DETECTED
CORONAVIRUS NL63-RVPPCR: NOT DETECTED
CORONAVIRUS OC43-RVPPCR: DETECTED — AB
Chlamydophila pneumoniae: NOT DETECTED
INFLUENZA A-RVPPCR: NOT DETECTED
Influenza B: NOT DETECTED
MYCOPLASMA PNEUMONIAE-RVPPCR: NOT DETECTED
Metapneumovirus: NOT DETECTED
PARAINFLUENZA VIRUS 1-RVPPCR: NOT DETECTED
PARAINFLUENZA VIRUS 4-RVPPCR: NOT DETECTED
Parainfluenza Virus 2: NOT DETECTED
Parainfluenza Virus 3: NOT DETECTED
Respiratory Syncytial Virus: NOT DETECTED
Rhinovirus / Enterovirus: NOT DETECTED

## 2017-11-04 LAB — CBC
HEMATOCRIT: 39.1 % (ref 39.0–52.0)
HEMOGLOBIN: 12.5 g/dL — AB (ref 13.0–17.0)
MCH: 28.7 pg (ref 26.0–34.0)
MCHC: 32 g/dL (ref 30.0–36.0)
MCV: 89.9 fL (ref 78.0–100.0)
Platelets: 248 10*3/uL (ref 150–400)
RBC: 4.35 MIL/uL (ref 4.22–5.81)
RDW: 14.7 % (ref 11.5–15.5)
WBC: 8 10*3/uL (ref 4.0–10.5)

## 2017-11-04 LAB — TROPONIN I: Troponin I: <0.03 ng/mL (ref <0.03)

## 2017-11-04 LAB — BRAIN NATRIURETIC PEPTIDE: B NATRIURETIC PEPTIDE 5: 81 pg/mL (ref 0.0–100.0)

## 2017-11-04 MED ORDER — BUMETANIDE 1 MG PO TABS: 1.0000 mg | ORAL_TABLET | Freq: Two times a day (BID) | ORAL | 0 refills | Status: DC

## 2017-11-04 MED ORDER — FUROSEMIDE 10 MG/ML IJ SOLN
80.0000 mg | Freq: Once | INTRAMUSCULAR | Status: AC
Start: 2017-11-04 — End: 2017-11-04
  Administered 2017-11-04: 80 mg via INTRAVENOUS
  Filled 2017-11-04: qty 8

## 2017-11-04 MED ORDER — PREDNISONE 10 MG PO TABS: 20.0000 mg | ORAL_TABLET | Freq: Every day | ORAL | 0 refills | Status: DC

## 2017-11-04 MED ORDER — METHYLPREDNISOLONE SODIUM SUCC 125 MG IJ SOLR
125.0000 mg | Freq: Once | INTRAMUSCULAR | Status: AC
  Administered 2017-11-04: 125 mg via INTRAVENOUS
  Filled 2017-11-04: qty 2

## 2017-11-04 MED ORDER — ALBUTEROL SULFATE HFA 108 (90 BASE) MCG/ACT IN AERS
2.0000 | INHALATION_SPRAY | RESPIRATORY_TRACT | Status: DC | PRN
  Administered 2017-11-04: 2 via RESPIRATORY_TRACT
  Filled 2017-11-04: qty 6.7

## 2017-11-04 MED ORDER — ALBUTEROL SULFATE (2.5 MG/3ML) 0.083% IN NEBU
5.0000 mg | INHALATION_SOLUTION | Freq: Once | RESPIRATORY_TRACT | Status: AC
  Administered 2017-11-04: 5 mg via RESPIRATORY_TRACT
  Filled 2017-11-04: qty 6

## 2017-11-04 MED ORDER — AEROCHAMBER PLUS FLO-VU MEDIUM MISC
1.0000 | Freq: Once | Status: AC
  Administered 2017-11-04: 1
  Filled 2017-11-04: qty 1

## 2017-11-04 NOTE — Telephone Encounter (Signed)
Called patient regarding message below. No answer, unable to leave message.  

## 2017-11-04 NOTE — Telephone Encounter (Signed)
Spoke to patient's wife. I explained message below. She states that she was told by Dr. Cristopher EstimableBanerjee's office that the doppler was down and might be fixed by 11/17/17. She states that the patient 'is just so sick.' I told her I would talk to Dr. Tracie HarrierHagler.  Per Dr. Tracie HarrierHagler, patient advised to go to [facility].

## 2017-11-04 NOTE — Discharge Instructions (Signed)
Increase bumetanide (bumex) to two in the morning, continue one at night. Continue doxycycline You can use your albuterol up to every 4 hours. Return if you are worse Recheck with Dr. Tracie HarrierHagler next week.

## 2017-11-04 NOTE — ED Provider Notes (Addendum)
[facility] EMERGENCY DEPARTMENT Provider Note   CSN: [id number] Arrival date & time: 11/04/17  1533     History   Chief Complaint Chief Complaint  Patient presents with  . Shortness of Breath    HPI Eddie Mitchell is a 84 y.o. male.  HPI 84 y.o male ho mi, copd, chf presents today with worsening dyspnea, cough productive of green sputum on doxycycline one week and weight gain for one month.  Denies chest pain but states no pain, just "jiggling" in chest this am.  PMD and cardiologist and pulmonary all in [place] although establishing withDr.Hagler here in [place] now.  Weight up to 280s from baseline of 230s.  Patient has had flu shot.  Past Medical History:  Diagnosis Date  . Alcohol abuse   . AMI (acute myocardial infarction) (HCC)    s/p 2 stents. Dr. Mervin Hack  . Arthritis   . Cardiovascular disease   . CHF (congestive heart failure) (HCC)   . COPD (chronic obstructive pulmonary disease) (HCC)   . Hypertension   . Tobacco abuse    quit in 1992; smoked cigarettes for 50 years/ Marlboro Lights, 1/2 ppd or less    There are no active problems to display for this patient.   Past Surgical History:  Procedure Laterality Date  . APPENDECTOMY    . BACK SURGERY         Home Medications    Prior to Admission medications   Medication Sig Start Date End Date Taking? Authorizing Provider  apixaban (ELIQUIS) 5 MG TABS tablet Eliquis 5 mg tablet  Take 1 tablet twice a day by oral route for 90 days.    [provider]  aspirin EC 81 MG tablet Take 81 mg by mouth daily.    [provider]  atorvastatin (LIPITOR) 40 MG tablet Take 40 mg by mouth daily.    [provider]  budesonide-formoterol (SYMBICORT) 160-4.5 MCG/ACT inhaler Inhale into the lungs. 10/18/14   [provider]  bumetanide (BUMEX) 1 MG tablet bumetanide 1 mg tablet  Take 1 tablet twice a day by oral route for 90 days.    [provider]  diltiazem (CARTIA  XT) 180 MG 24 hr capsule diltiazem CD 180 mg capsule,extended release 24 hr  Take 1 capsule every day by oral route for 90 days.    [provider]  doxycycline (VIBRA-TABS) 100 MG tablet Take 1 tablet (100 mg total) by mouth 2 (two) times daily. 10/28/17   Aliene Beams, MD  lisinopril (PRINIVIL,ZESTRIL) 10 MG tablet Take 10 mg by mouth daily.    [provider]  metoprolol tartrate (LOPRESSOR) 25 MG tablet Take 25 mg by mouth 2 (two) times daily.    [provider]  nitroGLYCERIN (NITROSTAT) 0.4 MG SL tablet Place 0.4 mg under the tongue every 5 (five) minutes as needed for chest pain.    [provider]  pramipexole (MIRAPEX) 0.5 MG tablet Take 2 tablets (1 mg total) by mouth at bedtime. 10/28/17   Aliene Beams, MD    Family History History reviewed. No pertinent family history.  Social History Social History   Tobacco Use  . Smoking status: Former Games developer  . Smokeless tobacco: Never Used  Substance Use Topics  . Alcohol use: No  . Drug use: No     Allergies   Patient has no known allergies.   Review of Systems Review of Systems   Physical Exam Updated Vital Signs BP (!) 135/54 (BP Location: Left Arm)  Pulse 65   Temp 98.5 F (36.9 C) (Oral)   Resp (!) 24   Ht 1.778 m (5\' 10" )   Wt 121.2 kg (267 lb 2 oz)   SpO2 96%   BMI 38.33 kg/m   Physical Exam  Constitutional: He appears well-developed and well-nourished.  HENT:  Head: Normocephalic and atraumatic.  Mouth/Throat: Oropharynx is clear and moist.  Eyes: Pupils are equal, round, and reactive to light.  Neck: Normal range of motion. No JVD present.  Cardiovascular: Normal rate and regular rhythm.  Pulmonary/Chest: Effort normal. Tachypnea noted.  Decreased bs bilateral bases, some scattered expiratory wheezes  Abdominal: Soft. Bowel sounds are normal. He exhibits distension. There is no tenderness.  Musculoskeletal: Normal range of motion.       Right lower leg: He  exhibits edema. He exhibits no tenderness.       Left lower leg: He exhibits edema. He exhibits no tenderness.  Neurological: He is alert.  Skin: Skin is warm and dry. Capillary refill takes less than 2 seconds.  Psychiatric: He has a normal mood and affect.  Nursing note and vitals reviewed.    ED Treatments / Results  Labs (all labs ordered are listed, but only abnormal results are displayed) Labs Reviewed  BASIC METABOLIC PANEL  CBC  TROPONIN I  BRAIN NATRIURETIC PEPTIDE    EKG  EKG Interpretation  Date/Time:  Friday November 04 2017 15:53:44 EST Ventricular Rate:  65 PR Interval:  162 QRS Duration: 162 QT Interval:  456 QTC Calculation: 474 R Axis:   -80 Text Interpretation:  Normal sinus rhythm Right bundle branch block Left anterior fascicular block No previous Abnormal ECG Confirmed by Vanetta MuldersZackowski, Scott [phone]([id number]) on 11/04/2017 4:13:44 PM       Radiology Dg Chest 2 View  Result Date: 11/04/2017 CLINICAL DATA:  LEFT side chest tenderness beginning this morning, generalized body weakness for 2-3 weeks, COPD, former smoker, MI, CHF, hypertension EXAM: CHEST  2 VIEW COMPARISON:  None FINDINGS: Normal heart size, mediastinal contours, and pulmonary vascularity. Atherosclerotic calcification aorta. Lungs appear minimally hyperinflated but clear. No infiltrate, pleural effusion, or pneumothorax. Pleural thickening versus more likely subpleural fat at the lateral lower chest bilaterally. Bones demineralized. IMPRESSION: No acute abnormalities. Electronically Signed   By: Ulyses SouthwardMark  Boles M.D.   On: 11/04/2017 16:26    Procedures Procedures (including critical care time)  Medications Ordered in ED Medications - No data to display   Initial Impression / Assessment and Plan / ED Course  I have reviewed the triage vital signs and the nursing notes.  Pertinent labs & imaging results that were available during my care of the patient were reviewed by me and considered in my medical  decision making (see chart for details).    7:35 PM Patient states he feels greatly improved after meds here.  No evidence of acute decompensation of chf or copd but likely some component of each.  He is out of his albuterol neb at home, plan hfa from here to bridge until wife is able to fill in am, will add prednisone and increase bumex.  He will continue doxycycline.  Patient has follow up with Dr. Tracie HarrierHagler next week.  We have discussed return precautions and need for follow up and patient and family voice understanding.   Final Clinical Impressions(s) / ED Diagnoses   Final diagnoses:  COPD exacerbation (HCC)  Dyspnea, unspecified type    ED Discharge Orders        Ordered    predniSONE (  DELTASONE) 10 MG tablet  Daily     11/04/17 1938    bumetanide (BUMEX) 1 MG tablet  2 times daily     11/04/17 1940       Margarita Grizzle, MD 11/04/17 Ander Gaster    Margarita Grizzle, MD 11/14/17 [phone]

## 2017-11-04 NOTE — ED Triage Notes (Addendum)
Patient sent by Dr Tracie HarrierHagler for shortness of breath due to productive cough x3 weeks. Denies any fevers. Patient states thick green sputum. Patient also reports intermittent left side chest pain. Patient states dizziness and generalized weakness. Denies any nausea or vomiting. Per patient cardiac hx with x4 cardiac stents and CHF. Patient states swelling in legs bilaterally. Patient states that recently switched from plavix to eliquis.

## 2017-11-08 ENCOUNTER — Telehealth: Payer: Self-pay | Admitting: Family Medicine

## 2017-11-08 NOTE — Telephone Encounter (Signed)
Eddie Lernerarlena Breeze, home health nurse for patient, called in with a concern regarding the patient's weight. When he went to the emergency room, they gave him IV lasix, which helped him greatly. Since then, he has gained 6 pounds, has increased shortness of breath. He is taking Bumex 1 mg 2 tab BID, but this is not what he used to be one. When he was put on abx and steroids, he was taken off previous diuretic. He was on Lasix 20 mg tab 1 daily.   Patient and his wife were prepared to go back to the ED. I am in agreement since PCP is gone for the day and will not return until Thursday. I did inform that he is due for a follow up in our office. They will go to ED tonight and call back to schedule that appointment

## 2017-11-10 ENCOUNTER — Telehealth: Payer: Self-pay | Admitting: *Deleted

## 2017-11-10 NOTE — Telephone Encounter (Signed)
Patient's wife called requesting an appointment for the patient stating the patient is very sick, I made her aware that Dr Tracie HarrierHagler does not have a opening. Patient's wife requesting for Kaleen OdeaBreanna to call her so she can discuss the home visits with the home health nurse. Please advise [phone]

## 2017-11-11 NOTE — Telephone Encounter (Signed)
Patient is on schedule for 2/5 at 10:40

## 2017-11-12 ENCOUNTER — Other Ambulatory Visit: Payer: Self-pay | Admitting: Family Medicine

## 2017-11-12 DIAGNOSIS — G2581 Restless legs syndrome: Secondary | ICD-10-CM

## 2017-11-13 ENCOUNTER — Telehealth: Payer: Self-pay | Admitting: Family Medicine

## 2017-11-13 ENCOUNTER — Other Ambulatory Visit: Payer: Self-pay | Admitting: Family Medicine

## 2017-11-13 MED ORDER — PRAMIPEXOLE DIHYDROCHLORIDE 0.5 MG PO TABS: ORAL_TABLET | ORAL | 0 refills | Status: DC

## 2017-11-13 NOTE — Telephone Encounter (Signed)
Spouse called in for medication for restless legs to be sent until he sees his PCP this week, 5 day supply sent  To his [facility]

## 2017-11-15 ENCOUNTER — Other Ambulatory Visit: Payer: Self-pay

## 2017-11-15 ENCOUNTER — Encounter: Payer: Self-pay | Admitting: Family Medicine

## 2017-11-15 ENCOUNTER — Ambulatory Visit: Payer: Medicare PPO | Admitting: Family Medicine

## 2017-11-15 VITALS — BP 170/78 | HR 90 | Temp 98.3°F | Resp 16 | Ht 70.0 in | Wt 282.2 lb

## 2017-11-15 DIAGNOSIS — Z79899 Other long term (current) drug therapy: Secondary | ICD-10-CM | POA: Diagnosis not present

## 2017-11-15 DIAGNOSIS — G2581 Restless legs syndrome: Secondary | ICD-10-CM | POA: Diagnosis not present

## 2017-11-15 DIAGNOSIS — R739 Hyperglycemia, unspecified: Secondary | ICD-10-CM

## 2017-11-15 DIAGNOSIS — J440 Chronic obstructive pulmonary disease with acute lower respiratory infection: Secondary | ICD-10-CM

## 2017-11-15 DIAGNOSIS — R5383 Other fatigue: Secondary | ICD-10-CM

## 2017-11-15 DIAGNOSIS — J209 Acute bronchitis, unspecified: Secondary | ICD-10-CM

## 2017-11-15 MED ORDER — PRAMIPEXOLE DIHYDROCHLORIDE 0.5 MG PO TABS: 1.0000 mg | ORAL_TABLET | Freq: Every day | ORAL | 1 refills | Status: DC

## 2017-11-15 NOTE — Progress Notes (Signed)
Patient ID: Eddie Mitchell, male    DOB: 1934-07-07, 84 y.o.   MRN: [id number]  Chief Complaint  Patient presents with  . Follow-up  . Nausea  . Restless legs    Allergies Patient has no known allergies.  Subjective:   Eddie Mitchell is a 84 y.o. male who presents to [facility] today.  HPI Mr. Smallman presents today for a follow-up visit.  He reports that he has been feeling better in regards to his breathing.  He has completed the antibiotics and has been using his Symbicort inhaler as directed.  Has also been using his nebulizer as needed.  Reports that he is not as short of breath as he was when he was last seen here.  He is able to walk better without feeling as winded.  He still does have the swelling in his legs but is lost a few pounds.  He denies any chest pain.  Had felt nauseated over the past several days but it has improved.  Denies any vomiting or diarrhea.  No fevers.  He reports that his main complaint is he is still very fatigued.  He reports that his energy is low.  He reports that over the past week or 2 he has been somewhat down and sad because 1 of his best friends is in the hospital.  He reports that his friend is on a ventilator and is very sick.  He reports that this has made him sad and feels somewhat weepy.    Reports that he has an appointment with Dr. Catha Gosselin tomorrow at 2 pm. Reports that fell out of kitchen chair yesterday b/c fell asleep in chair and fell onto the floor. No injuries.  Has been using CPAP machine. He has not taken any fluid pills or BP pills this morning.  He reports that he did not want to take medications because he knew it would make him go to the bathroom and he did not want to have to go to the bathroom while he was out today.  He reports that he does not usually miss doses  Blood pressure medication.  Denies any current pain in his chest.  No numbness or tingling in his extremities.  He denies a headache.  Reports that his vision is  normal  He does report that he will be seen at the [place] on Thursday.     Past Medical History:  Diagnosis Date  . Alcohol abuse   . AMI (acute myocardial infarction) (HCC)    s/p 2 stents. Dr. Mervin Hack  . Arthritis   . Cardiovascular disease   . CHF (congestive heart failure) (HCC)   . COPD (chronic obstructive pulmonary disease) (HCC)   . Hypertension   . Tobacco abuse    quit in 1992; smoked cigarettes for 50 years/ Marlboro Lights, 1/2 ppd or less    Past Surgical History:  Procedure Laterality Date  . APPENDECTOMY    . BACK SURGERY      No family history on file.   Social History   Socioeconomic History  . Marital status: Married    Spouse name: None  . Number of children: None  . Years of education: None  . Highest education level: None  Social Needs  . Financial resource strain: None  . Food insecurity - worry: None  . Food insecurity - inability: None  . Transportation needs - medical: None  . Transportation needs - non-medical: None  Occupational History  . None  Tobacco Use  . Smoking status: Former Games developer  . Smokeless tobacco: Never Used  Substance and Sexual Activity  . Alcohol use: No  . Drug use: No  . Sexual activity: Yes  Other Topics Concern  . None  Social History Narrative   Married for 60 year and wife died. Has four children (3 girls and 1 boy).   Married second time for one year.    Eats all foods.   Drive.   Wear seatbelt.   Retired for over 20 years.   Worked at [employer].    Current Outpatient Medications on File Prior to Visit  Medication Sig Dispense Refill  . apixaban (ELIQUIS) 5 MG TABS tablet Eliquis 5 mg tablet  Take 1 tablet twice a day by oral route for 90 days.    Marland Kitchen aspirin EC 81 MG tablet Take 81 mg by mouth daily.    Marland Kitchen atorvastatin (LIPITOR) 40 MG tablet Take 40 mg by mouth daily.    . budesonide-formoterol (SYMBICORT) 160-4.5 MCG/ACT inhaler Inhale into the lungs.    . bumetanide (BUMEX) 1 MG tablet Take 1 tablet (1  mg total) by mouth 2 (two) times daily. Take 2 tablets in am, take one tablet in evening 30 tablet 0  . diltiazem (CARTIA XT) 180 MG 24 hr capsule diltiazem CD 180 mg capsule,extended release 24 hr  Take 1 capsule every day by oral route for 90 days.    Marland Kitchen lisinopril (PRINIVIL,ZESTRIL) 10 MG tablet Take 10 mg by mouth daily.    . metoprolol tartrate (LOPRESSOR) 25 MG tablet Take 25 mg by mouth 2 (two) times daily.    . nitroGLYCERIN (NITROSTAT) 0.4 MG SL tablet Place 0.4 mg under the tongue every 5 (five) minutes as needed for chest pain.    . pramipexole (MIRAPEX) 0.5 MG tablet Take 2 tablets by mouth at bedtime 10 tablet 0  . doxycycline (VIBRA-TABS) 100 MG tablet Take 1 tablet (100 mg total) by mouth 2 (two) times daily. (Patient not taking: Reported on 11/15/2017) 20 tablet 0  . predniSONE (DELTASONE) 10 MG tablet Take 2 tablets (20 mg total) by mouth daily. (Patient not taking: Reported on 11/15/2017) 10 tablet 0   No current facility-administered medications on file prior to visit.     Review of Systems   Objective:   BP (!) 170/78 (BP Location: Left Arm, Patient Position: Sitting, Cuff Size: Normal)   Pulse 90   Temp 98.3 F (36.8 C) (Temporal)   Resp 16   Ht 5\' 10"  (1.778 m)   Wt 282 lb 4 oz (128 kg)   SpO2 97%   BMI 40.50 kg/m   Physical Exam  Constitutional: He is oriented to person, place, and time. He appears well-nourished.  Elderly appearing male, appearance consistent with his stated age.  Appears less winded at rest than last visit.  Gait arthritic.  He has a hard time going from seated to standing position without support.  HENT:  Head: Normocephalic and atraumatic.  Eyes: EOM are normal. Pupils are equal, round, and reactive to light. No scleral icterus.  Pulmonary/Chest: Effort normal and breath sounds normal. No respiratory distress. He has no wheezes. He has no rales. He exhibits no tenderness.  More comfortable at rest today with breathing even at rest position.   No wheezing and lungs present.  No accessory muscle use.  No rhonchi or crackles present.  Neurological: He is alert and oriented to person, place, and time. He displays normal reflexes. No cranial nerve  deficit. Coordination normal.  Psychiatric: He has a normal mood and affect. His behavior is normal. Judgment and thought content normal.  Patient with appropriate mood and affect.  He does appear sad and when discussing his friends state of health.  However his affect has a full range and is appropriate.  No suicidal or homicidal ideations.     Assessment and Plan  1. Fatigue, unspecified type Discussed possibilities of fatigue with patient today.  He does have a history of anemia in the past.  Will check labs today to rule out metabolic etiology of fatigue. - CBC with Differential/Platelet - TSH  2. Hyperglycemia Patient definitely at risk for diabetes.  Check A1c today.  Sugar and salt reduction recommended. - Hemoglobin A1c  3. High risk medication use Check due to chronic diuretic use. - Basic metabolic panel  4. Restless leg syndrome Did discuss current dosing with patient and his wife today.  He is at a higher dose than recommended and has been at this dose for quite some time.  Sedation precautions and side effects discussed.  He only takes this medication at night.  We did discuss fall risk regarding getting up in the middle of the night with this medication.  He voiced understanding. - pramipexole (MIRAPEX) 0.5 MG tablet; Take 2 tablets (1 mg total) by mouth at bedtime.  Dispense: 180 tablet; Refill: 1  5. Acute bronchitis with COPD (HCC) Patient is breathing in better state today.  He should continue his Symbicort twice daily as directed.  We did discuss possibly starting Spiriva.  We will wait till he follows up with cardiology tomorrow and see if they believe he is breathing as a result more of cardiac versus pulmonary etiology.  His swelling is improved.  I believe his  symptoms were related to some fluid overload possibly secondary to some medication noncompliance.  I did discuss with patient today in detail that his blood pressure was extremely elevated and puts him at risk for stroke and cardiovascular complications.  He did voice understanding.  He reports he will go directly home and take his medications.  Compliance with medication, diet, and fluid intake was discussed.  He will have copies and notes from his office visits with cardiology and from the [facility] sent to our office for review.  Return in about 4 weeks (around 12/13/2017) for follow up BP. Aliene Beamsachel Hagler, MD 11/15/2017

## 2017-11-16 DIAGNOSIS — J209 Acute bronchitis, unspecified: Secondary | ICD-10-CM | POA: Insufficient documentation

## 2017-11-16 DIAGNOSIS — G2581 Restless legs syndrome: Secondary | ICD-10-CM | POA: Insufficient documentation

## 2017-11-16 DIAGNOSIS — Z79899 Other long term (current) drug therapy: Secondary | ICD-10-CM | POA: Insufficient documentation

## 2017-11-16 DIAGNOSIS — J440 Chronic obstructive pulmonary disease with acute lower respiratory infection: Secondary | ICD-10-CM

## 2017-11-16 DIAGNOSIS — R5383 Other fatigue: Secondary | ICD-10-CM | POA: Insufficient documentation

## 2017-11-16 DIAGNOSIS — R739 Hyperglycemia, unspecified: Secondary | ICD-10-CM | POA: Insufficient documentation

## 2017-11-16 LAB — CBC WITH DIFFERENTIAL/PLATELET
Basophils Absolute: 29 cells/uL (ref 0–200)
Basophils Relative: 0.3 %
Eosinophils Absolute: 147 cells/uL (ref 15–500)
Eosinophils Relative: 1.5 %
HEMATOCRIT: 35.7 % — AB (ref 38.5–50.0)
Hemoglobin: 12.1 g/dL — ABNORMAL LOW (ref 13.2–17.1)
LYMPHS ABS: 1450 {cells}/uL (ref 850–3900)
MCH: 29.2 pg (ref 27.0–33.0)
MCHC: 33.9 g/dL (ref 32.0–36.0)
MCV: 86 fL (ref 80.0–100.0)
MPV: 9.7 fL (ref 7.5–12.5)
Monocytes Relative: 7.2 %
Neutro Abs: 7468 cells/uL (ref 1500–7800)
Neutrophils Relative %: 76.2 %
PLATELETS: 257 10*3/uL (ref 140–400)
RBC: 4.15 10*6/uL — AB (ref 4.20–5.80)
RDW: 13.6 % (ref 11.0–15.0)
Total Lymphocyte: 14.8 %
WBC: 9.8 10*3/uL (ref 3.8–10.8)
WBCMIX: 706 {cells}/uL (ref 200–950)

## 2017-11-16 LAB — HEMOGLOBIN A1C
EAG (MMOL/L): 8.2 (calc)
Hgb A1c MFr Bld: 6.8 % of total Hgb — ABNORMAL HIGH (ref <5.7)
Mean Plasma Glucose: 148 (calc)

## 2017-11-16 LAB — BASIC METABOLIC PANEL
BUN: 21 mg/dL (ref 7–25)
CO2: 32 mmol/L (ref 20–32)
CREATININE: 1.11 mg/dL (ref 0.70–1.11)
Calcium: 9.3 mg/dL (ref 8.6–10.3)
Chloride: 103 mmol/L (ref 98–110)
GLUCOSE: 116 mg/dL (ref 65–139)
POTASSIUM: 3.9 mmol/L (ref 3.5–5.3)
SODIUM: 140 mmol/L (ref 135–146)

## 2017-11-16 LAB — TSH: TSH: 0.65 mIU/L (ref 0.40–4.50)

## 2017-11-17 ENCOUNTER — Encounter: Payer: Self-pay | Admitting: Family Medicine

## 2017-11-25 ENCOUNTER — Encounter: Payer: Self-pay | Admitting: Family Medicine

## 2017-11-25 ENCOUNTER — Ambulatory Visit (INDEPENDENT_AMBULATORY_CARE_PROVIDER_SITE_OTHER): Payer: Medicare PPO | Admitting: Family Medicine

## 2017-11-25 VITALS — BP 140/56 | HR 82 | Temp 98.8°F | Resp 16 | Ht 70.0 in | Wt 280.2 lb

## 2017-11-25 DIAGNOSIS — E785 Hyperlipidemia, unspecified: Secondary | ICD-10-CM

## 2017-11-25 DIAGNOSIS — R0609 Other forms of dyspnea: Secondary | ICD-10-CM

## 2017-11-25 DIAGNOSIS — I2510 Atherosclerotic heart disease of native coronary artery without angina pectoris: Secondary | ICD-10-CM | POA: Diagnosis not present

## 2017-11-25 DIAGNOSIS — K2100 Gastro-esophageal reflux disease with esophagitis, without bleeding: Secondary | ICD-10-CM

## 2017-11-25 DIAGNOSIS — F320 Major depressive disorder, single episode, mild: Secondary | ICD-10-CM

## 2017-11-25 DIAGNOSIS — J449 Chronic obstructive pulmonary disease, unspecified: Secondary | ICD-10-CM | POA: Diagnosis not present

## 2017-11-25 DIAGNOSIS — I10 Essential (primary) hypertension: Secondary | ICD-10-CM | POA: Diagnosis not present

## 2017-11-25 DIAGNOSIS — E1169 Type 2 diabetes mellitus with other specified complication: Secondary | ICD-10-CM | POA: Diagnosis not present

## 2017-11-25 MED ORDER — MIRTAZAPINE 15 MG PO TABS: 15.0000 mg | ORAL_TABLET | Freq: Every day | ORAL | 0 refills | Status: DC

## 2017-11-25 MED ORDER — TIOTROPIUM BROMIDE MONOHYDRATE 18 MCG IN CAPS: 18.0000 ug | ORAL_CAPSULE | Freq: Every day | RESPIRATORY_TRACT | 12 refills | Status: AC

## 2017-11-25 NOTE — Progress Notes (Signed)
Eddie Mitchell ID: Eddie Mitchell, male    DOB: May 02, 1934, 84 y.o.   MRN: [id number]  Chief Complaint  Eddie Mitchell presents with  . Edema    Allergies Eddie Mitchell has no known allergies.  Subjective:   Eddie Mitchell is a 84 y.o. male who presents to [facility] today.  HPI Eddie Mitchell presents today for follow-up visit.  Since Eddie Mitchell was last seen in our office Eddie Mitchell has been evaluated by Dr. Banerjee/Cardiology.  I have requested those records and reviewed them.  Eddie Mitchell diuretics were increased and Eddie Mitchell has an upcoming study next week for evaluation of edema/questionable venous insufficiency.  Eddie Mitchell is also followed by  Dr. Marcellus ScottBoyd/Pulmonary but has not seen him in a month or 2.  Eddie Mitchell reports that Eddie Mitchell did take Eddie Mitchell blood pressure medications today.  Eddie Mitchell reports Eddie Mitchell has been compliant with him since Eddie Mitchell was last seen here.  Eddie Mitchell reports that Eddie Mitchell swelling is not gotten any better even with the diuretic change in addition of medication.  Eddie Mitchell reports that Eddie Mitchell weight has remained stable and Eddie Mitchell has not lost any more pounds.  Eddie Mitchell reports that all of Eddie Mitchell health problems are very distressing to him.  Eddie Mitchell reports that Eddie Mitchell is still having some shortness of breath with exertion.  Eddie Mitchell reports that Eddie Mitchell still feels like Eddie Mitchell is wheezing some.  Eddie Mitchell is using Eddie Mitchell Symbicort twice a day as directed.  Eddie Mitchell reports Eddie Mitchell has been compliant with this medication.  Eddie Mitchell is using Eddie Mitchell nebulizers on a daily basis.  Eddie Mitchell does not have an upcoming appointment with pulmonology.  Eddie Mitchell has never been on Spiriva.  Eddie Mitchell does have a history of COPD and significant tobacco history.  Eddie Mitchell reports that Eddie Mitchell wishes Eddie Mitchell could get back to the state of health that Eddie Mitchell was a year ago.  Eddie Mitchell reports that Eddie Mitchell feels sad that Eddie Mitchell is not the same person Eddie Mitchell was back then.  Eddie Mitchell reports that Eddie Mitchell appetite has been down and Eddie Mitchell really does not feel like eating.  Eddie Mitchell has had several friends die and it has weighed on him.  Eddie Mitchell reports that many of the things Eddie Mitchell used to do and enjoy Eddie Mitchell cannot do because of Eddie Mitchell  health status and level of functioning.  Eddie Mitchell reports Eddie Mitchell finds it hard to relax and calm down.  Eddie Mitchell reports Eddie Mitchell mind races especially at night when Eddie Mitchell tries to sleep.  Eddie Mitchell reports the weather has also gotten him down because Eddie Mitchell cannot get outside.  Eddie Mitchell denies any suicidal or homicidal ideations.  Eddie Mitchell has never been treated for depression in the past.  Eddie Mitchell reports that most the time Eddie Mitchell just feels like crying and that Eddie Mitchell feels sad.  Eddie Mitchell does have good family support and has a very supportive wife.  Eddie Mitchell wife reports that Eddie Mitchell is not been out of the house very much other than doctor's visits.  They used to go out to dinner and interact socially with other couples.  They have not done this for quite some time. Eddie Mitchell reports that Eddie Mitchell has a strong faith and believes in God.  Eddie Mitchell reports Eddie Mitchell wishes Eddie Mitchell could still seeing and participate in some of the activities that Eddie Mitchell used to.  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  Eddie Mitchell does feel hopeful about Eddie Mitchell situation and is looking forward to going and seeing the [facility] boys in concert soon.  Eddie Mitchell is also here to discuss Eddie Mitchell lab work today.  Eddie Mitchell  labs revealed a new diagnosis of diabetes mellitus type 2.  Eddie Mitchell has never been aware of this diagnosis.  Eddie Mitchell is not on any medications for this condition.  Eddie Mitchell is not been trying to modify Eddie Mitchell diet to control Eddie Mitchell blood sugar.  Eddie Mitchell denies any sores on Eddie Mitchell feet.     Past Medical History:  Diagnosis Date  . Alcohol abuse   . AMI (acute myocardial infarction) (HCC)    s/p 2 stents. Dr. Mervin Hack  . Arthritis   . Cardiovascular disease   . CHF (congestive heart failure) (HCC)   . COPD (chronic obstructive pulmonary disease) (HCC)   . Hypertension   . Tobacco abuse    quit in 1992; smoked cigarettes for 50 years/ Marlboro Lights, 1/2 ppd or less    Past Surgical History:  Procedure Laterality Date  . APPENDECTOMY    . BACK SURGERY      No family history on file.   Social History   Socioeconomic History  . Marital  status: Married    Spouse name: None  . Number of children: None  . Years of education: None  . Highest education level: None  Social Needs  . Financial resource strain: None  . Food insecurity - worry: None  . Food insecurity - inability: None  . Transportation needs - medical: None  . Transportation needs - non-medical: None  Occupational History  . None  Tobacco Use  . Smoking status: Former Games developer  . Smokeless tobacco: Never Used  Substance and Sexual Activity  . Alcohol use: No  . Drug use: No  . Sexual activity: Yes  Other Topics Concern  . None  Social History Narrative   Married for 60 year and wife died. Has four children (3 girls and 1 boy).   Married second time for one year.    Eats all foods.   Drive.   Wear seatbelt.   Retired for over 20 years.   Worked at [employer].    Current Outpatient Medications on File Prior to Visit  Medication Sig Dispense Refill  . apixaban (ELIQUIS) 5 MG TABS tablet Eliquis 5 mg tablet  Take 1 tablet twice a day by oral route for 90 days.    Marland Kitchen aspirin EC 81 MG tablet Take 81 mg by mouth daily.    Marland Kitchen atorvastatin (LIPITOR) 40 MG tablet Take 40 mg by mouth daily.    . budesonide-formoterol (SYMBICORT) 160-4.5 MCG/ACT inhaler Inhale into the lungs.    Marland Kitchen diltiazem (CARTIA XT) 180 MG 24 hr capsule diltiazem CD 180 mg capsule,extended release 24 hr  Take 1 capsule every day by oral route for 90 days.    Marland Kitchen lisinopril (PRINIVIL,ZESTRIL) 10 MG tablet Take 10 mg by mouth daily.    . metolazone (ZAROXOLYN) 2.5 MG tablet metolazone 2.5 mg tablet  Take 1 tablet every day by oral route.    . metoprolol tartrate (LOPRESSOR) 25 MG tablet Take 25 mg by mouth 2 (two) times daily.    . nitroGLYCERIN (NITROSTAT) 0.4 MG SL tablet Place 0.4 mg under the tongue every 5 (five) minutes as needed for chest pain.    . pramipexole (MIRAPEX) 0.5 MG tablet Take 2 tablets (1 mg total) by mouth at bedtime. 180 tablet 1   No current facility-administered  medications on file prior to visit.     Review of Systems  Constitutional: Positive for fatigue. Negative for chills, fever and unexpected weight change.  HENT: Negative for sinus pressure, sinus pain, sneezing, sore  throat and voice change.   Respiratory: Positive for cough, shortness of breath and wheezing. Negative for chest tightness.   Cardiovascular: Positive for leg swelling. Negative for chest pain and palpitations.  Gastrointestinal: Negative for abdominal pain, constipation, diarrhea, nausea and vomiting.  Genitourinary: Positive for frequency. Negative for dysuria, flank pain and urgency.  Musculoskeletal: Negative for arthralgias, myalgias and neck stiffness.  Skin: Negative for rash.  Neurological: Negative for dizziness, tremors, syncope, weakness, numbness and headaches.  Hematological: Negative for adenopathy. Does not bruise/bleed easily.  Psychiatric/Behavioral: Positive for agitation, dysphoric mood and sleep disturbance. Negative for behavioral problems and suicidal ideas. The Eddie Mitchell is nervous/anxious.      Objective:   BP (!) 140/56 (BP Location: Left Arm, Eddie Mitchell Position: Sitting, Cuff Size: Normal)   Pulse 82   Temp 98.8 F (37.1 C) (Temporal)   Resp 16   Ht 5\' 10"  (1.778 m)   Wt 280 lb 4 oz (127.1 kg)   SpO2 96%   BMI 40.21 kg/m   Physical Exam Vital signs reviewed.  Heart regular rate and rhythm.  Lungs reveal diffuse wheezes, scattered in lung fields.  Improved from previous visits.  No accessory muscle use.  Eddie Mitchell able to speak in complete sentences without getting winded.  Neck is supple with full range of motion.  Abdomen is soft, obese, nontender.  Extremities with 2+-3+ edema bilaterally.  Cranial nerves II through XII grossly intact.  Mood is depressed.  Affect congruent with mood.  Eddie Mitchell does show a full range of affect.  Eddie Mitchell does cry during visit and become tearful several times.  Eddie Mitchell denies suicidal or homicidal ideations.  No auditory or visual  hallucinations.  Eddie Mitchell thought processes are goal-directed there are no delusions, phobias, obsessions, or compulsions.  Eddie Mitchell insight and judgment are good.  Eddie Mitchell is well dressed, well groomed.  Assessment and Plan  1. Type 2 diabetes mellitus with other specified complication, without long-term current use of insulin (HCC) Discussed new diagnosis of diabetes with Eddie Mitchell today.  At this time we discussed that no medication is indicated for control.  However we did discuss dietary modifications that need to occur so that Eddie Mitchell blood sugars do not continue to increase.  We discussed low sugar and low salt diet and foods that would be acceptable.  Eddie Mitchell wife is motivated to help and make these changes.  We will plan on rechecking labs in 3 months.  Eddie Mitchell defers foot exam today.  Did not discuss diabetic eye exam because frankly, Eddie Mitchell is overwhelmed with Eddie Mitchell medical issues at this time and already has multiple health care visit scheduled.  2. HTN, goal below 130/80 Blood pressure is improved today.  Eddie Mitchell will continue with Eddie Mitchell compliance I did discuss that if Eddie Mitchell blood pressure is not less than 130/80 at the next visit we will consider increasing or adding a medication.  Eddie Mitchell voiced understanding.  3. Hyperlipidemia associated with type 2 diabetes mellitus (HCC) Continue cholesterol medication as directed.  4. Reflux esophagitis Continue reflux medication.  Do not suspect that this is a cause of Eddie Mitchell's wheezing or cough.  GERD precautions given.  5. Coronary artery disease without angina pectoris, unspecified vessel or lesion type, unspecified whether native or transplanted heart Eddie Mitchell has previously been seen by Dr. Catha Gosselin in [place].  Eddie Mitchell wishes to have Eddie Mitchell cardiology care transferred to the [facility].  Eddie Mitchell would like to see 1 of the cardiologist in [place], [place].  Eddie Mitchell will continue Eddie Mitchell current medication regimen and  keep Eddie Mitchell scheduled appointment with Dr. Catha Gosselin if Eddie Mitchell does  not have a sooner appointment with the cardiologist at [facility].  Eddie Mitchell voiced understanding and agreement with this. - Ambulatory referral to Cardiology  6. Dyspnea on exertion I suspect that Eddie Mitchell's dyspnea on exertion is a combination of Eddie Mitchell pulmonary and cardiovascular state.  Eddie Mitchell symptoms are most likely worsened by Eddie Mitchell morbid obesity and deconditioning.  I do believe that pulmonary/cardio rehab would be beneficial for this Eddie Mitchell.  We will wait and have him evaluated by cardiology and then move forward.  At this time, we will also try to maximize Eddie Mitchell COPD regimen. - Ambulatory referral to Cardiology  7. Depression, major, single episode, mild (HCC) Believe Eddie Mitchell symptoms are also were concerned by Eddie Mitchell mental status at this time.  I believe Eddie Mitchell is depressed regarding Eddie Mitchell medical problems and that Eddie Mitchell overall attitude is negative and depressed.  At this time will start Remeron at bedtime to help with Eddie Mitchell sleep and decreased appetite.  Eddie Mitchell was counseled concerning any worrisome signs and symptoms and told to follow-up or call clinic if Eddie Mitchell develops any problems.  Eddie Mitchell was told that if Eddie Mitchell mood worsens or if Eddie Mitchell developed any suicidal ideations to please contact medical help.  Him and Eddie Mitchell wife voiced understanding. Eddie Mitchell counseled in detail regarding the risks of medication. Told to call or return to clinic if develop any worrisome signs or symptoms. Eddie Mitchell voiced understanding.  Suicide risks evaluated and documented in note if present or in the area below.  Eddie Mitchell does not have/denies the following risks: previous suicide attempts, family history of suicide, access to lethal means, prior history of psychiatric disorder, history of alcohol or substance abuse disorder, recent loss of a loved one, or severe hopelessness. Eddie Mitchell denies access to firearms or if present will have removed from home/access.   Eddie Mitchell has protective factors of family and community support.  Eddie Mitchell reports that family  believes is behaving rationally. Eddie Mitchell displays problem solving skills.   Eddie Mitchell specifically denies suicide ideation. Eddie Mitchell has access/information to healthcare contacts if situation or mood changes where Eddie Mitchell is a risk to self or others or mood becomes unstable.   During the encounter, the Eddie Mitchell had good eye contact and firm handshake regarding safety contract and agreement to seek help if mood worsens and not to harm self.   Eddie Mitchell understands the treatment plan and is in agreement. Agrees to keep follow up and call prior or return to clinic if needed.   - mirtazapine (REMERON) 15 MG tablet; Take 1 tablet (15 mg total) by mouth at bedtime.  Dispense: 30 tablet; Refill: 0  8. Chronic obstructive pulmonary disease, unspecified COPD type (HCC) I do not believe that this time that Eddie Mitchell COPD management is maximized.  Eddie Mitchell will continue Eddie Mitchell Symbicort at this time.  We will add Spiriva on a daily basis.  Eddie Mitchell was counseled on use of the Hand inhaler device.  In addition Eddie Mitchell will watch a video on use of this agent and discuss with Eddie Mitchell pharmacist. - tiotropium (SPIRIVA HANDIHALER) 18 MCG inhalation capsule; Place 1 capsule (18 mcg total) into inhaler and inhale daily at 6 (six) AM.  Dispense: 30 capsule; Refill: 12  Return in about 2 weeks (around 12/09/2017) for follow up . Aliene Beams, MD 11/26/2017

## 2017-11-25 NOTE — Patient Instructions (Signed)
Tiotropium inhalation powder What is this medicine? TIOTROPIUM (tee oh TRO pee um) is a bronchodilator. It helps open up the airways in your lungs to make it easier to breathe. This medicine is used to treat chronic obstructive pulmonary disease (COPD), including emphysema and chronic bronchitis. Do not use this medicine for an acute attack. This medicine may be used for other purposes; ask your health care provider or pharmacist if you have questions. COMMON BRAND NAME(S): Spiriva HandiHaler What should I tell my health care provider before I take this medicine? They need to know if you have any of these conditions: -bladder problems or difficulty passing urine -glaucoma -kidney disease -prostate trouble -an unusual or allergic reaction to tiotropium, ipratropium, atropine, other medicines, lactose, foods, dyes, or preservatives -pregnant or trying to get pregnant -breast-feeding How should I use this medicine? This medicine is used in a special inhaler. Do NOT swallow the capsules. Do NOT use a spacer device. Follow the directions on the prescription label. Take your medicine at regular intervals. Do not take it more often than directed. Do not stop taking except on your doctor's advice. Make sure that you are using your inhaler correctly. Ask your doctor or health care provider if you have any questions. Small pieces of the capsule may get in your mouth or throat when you breathe in your medicine. This is normal and should not hurt you. Talk to your pediatrician regarding the use of this medicine in children. Special care may be needed. Overdosage: If you think you have taken too much of this medicine contact a poison control center or emergency room at once. NOTE: This medicine is only for you. Do not share this medicine with others. What if I miss a dose? If you miss a dose, use it as soon as you can. If it is almost time for your next dose, use only that dose and continue with your regular  schedule. Do not use double or extra doses. What may interact with this medicine? This medicine may also interact with the following medications: -atropine -antihistamines for allergy, cough and cold -certain medicines for bladder problems like oxybutynin, tolterodine -certain medicines for stomach problems like dicyclomine, hyoscyamine -certain medicines for travel sickness like scopolamine -certain medicines for Parkinson's disease like benztropine, trihexyphenidyl -ipratropium This list may not describe all possible interactions. Give your health care provider a list of all the medicines, herbs, non-prescription drugs, or dietary supplements you use. Also tell them if you smoke, drink alcohol, or use illegal drugs. Some items may interact with your medicine. What should I watch for while using this medicine? Visit your doctor or health care professional for regular checks on your progress. Tell your doctor if your symptoms do not improve. Do not use more medicine than directed. If your symptoms get worse while you are using this medicine, call your doctor right away. Do not get the this medicine in your eyes. It can cause irritation, pain, or blurred vision. You may get dizzy. Do not drive, use machinery, or do anything that needs mental alertness until you know how this medicine affects you. Do not stand or sit up quickly, especially if you are an older patient. This reduces the risk of dizzy or fainting spells. Clean the inhaler as directed in the patient information sheet that comes with this medicine. What side effects may I notice from receiving this medicine? Side effects that you should report to your doctor or health care professional as soon as possible: -allergic reactions  like skin rash or hives, swelling of the face, lips, or tongue -breathing problems -changes in vision -chest pain -fast heartbeat -infection or flu-like symptoms -trouble passing urine or change in the amount  of urine Side effects that usually do not require medical attention (report to your doctor or health care professional if they continue or are bothersome): -constipation -cough -dizziness -dry mouth -headache -muscle pain -sore throat -stomach upset This list may not describe all possible side effects. Call your doctor for medical advice about side effects. You may report side effects to FDA at 1-800-FDA-1088. Where should I keep my medicine? Keep out of the reach of children. Store at room temperature between 15 and 30 degrees C (59 and 86 degrees F). Protect from humidity. Keep capsules in the foil pack until you are ready to use. Throw away any unused medicine after the expiration date. NOTE: This sheet is a summary. It may not cover all possible information. If you have questions about this medicine, talk to your doctor, pharmacist, or health care provider.  2018 Elsevier/Gold Standard (2014-07-10 13:19:07)

## 2017-12-08 ENCOUNTER — Ambulatory Visit: Payer: Medicare PPO | Admitting: Family Medicine

## 2017-12-08 ENCOUNTER — Telehealth: Payer: Self-pay | Admitting: Family Medicine

## 2017-12-08 NOTE — Telephone Encounter (Signed)
Eddie Mitchell --calling from [facility], she is calling to see if you will give orders for Physical Therapy  (pt complains with balance problems) you can call and provide a verbal order over the phone #[phone]

## 2017-12-08 NOTE — Telephone Encounter (Signed)
Verbal orders given  

## 2017-12-08 NOTE — Telephone Encounter (Signed)
Please provide verbal order. I believe that PT would be good for him. Janine Limboachel H. Tracie HarrierHagler, MD

## 2017-12-12 ENCOUNTER — Encounter: Payer: Self-pay | Admitting: Family Medicine

## 2017-12-12 ENCOUNTER — Ambulatory Visit: Payer: Medicare PPO | Admitting: Family Medicine

## 2017-12-12 VITALS — BP 138/60 | HR 123 | Temp 98.0°F | Resp 16 | Ht 70.0 in | Wt 268.2 lb

## 2017-12-12 DIAGNOSIS — E1169 Type 2 diabetes mellitus with other specified complication: Secondary | ICD-10-CM

## 2017-12-12 DIAGNOSIS — G2581 Restless legs syndrome: Secondary | ICD-10-CM

## 2017-12-12 DIAGNOSIS — J449 Chronic obstructive pulmonary disease, unspecified: Secondary | ICD-10-CM

## 2017-12-12 DIAGNOSIS — E785 Hyperlipidemia, unspecified: Secondary | ICD-10-CM

## 2017-12-12 DIAGNOSIS — I10 Essential (primary) hypertension: Secondary | ICD-10-CM

## 2017-12-12 DIAGNOSIS — F320 Major depressive disorder, single episode, mild: Secondary | ICD-10-CM | POA: Diagnosis not present

## 2017-12-12 MED ORDER — MIRTAZAPINE 15 MG PO TABS: 15.0000 mg | ORAL_TABLET | Freq: Every day | ORAL | 3 refills | Status: DC

## 2017-12-12 NOTE — Progress Notes (Signed)
Patient ID: Eddie ArnHarold Fair, male    DOB: March 12, 1934, 84 y.o.   MRN: [id number]  Chief Complaint  Patient presents with  . Depression  . Insomnia  . Hypertension    Allergies Patient has no known allergies.  Subjective:   Eddie Mitchell is a 84 y.o. male who [facility].  HPI Mr. Chestine SporeClark presents today for follow-up of his mood.  He has been taking the Remeron each night as directed.  He denies any side effects with the medication.  He reports that he feels so much better.  His wife reports that she can tell he tremendous difference in his mood.  He does not seem sad, down, or depressed.  He reports that he does not feel sad or depressed.  His appetite is improved.  He is wanting to get out of the house and do things with friends and his wife.  He is able to sleep 6-7 hours at night without interruption.  He has not crying during the day.  He denies any suicidal or homicidal ideations.  He did not have any side effects with the medication.  He was unable to get the Spiriva filled because it was approximately $500 a month.  He reports if [facility] will write a prescription and he will not have to pay but a very small co-pay.  His swelling has improved now that he is gotten his compression stockings.  He did take his blood pressure medication prior to coming to clinic today.  He reports his blood pressures have been running better.  His energy is improved.  He has lost 7 pounds.    Past Medical History:  Diagnosis Date  . Alcohol abuse   . AMI (acute myocardial infarction) (HCC)    s/p 2 stents. Dr. Mervin HackBenerjee  . Arthritis   . Cardiovascular disease   . CHF (congestive heart failure) (HCC)   . COPD (chronic obstructive pulmonary disease) (HCC)   . Hypertension   . Tobacco abuse    quit in 1992; smoked cigarettes for 50 years/ Marlboro Lights, 1/2 ppd or less    Past Surgical History:  Procedure Laterality Date  .  APPENDECTOMY    . BACK SURGERY      No family history on file.   Social History   Socioeconomic History  . Marital status: Married    Spouse name: None  . Number of children: None  . Years of education: None  . Highest education level: None  Social Needs  . Financial resource strain: None  . Food insecurity - worry: None  . Food insecurity - inability: None  . Transportation needs - medical: None  . Transportation needs - non-medical: None  Occupational History  . None  Tobacco Use  . Smoking status: Former Games developermoker  . Smokeless tobacco: Never Used  Substance and Sexual Activity  . Alcohol use: No  . Drug use: No  . Sexual activity: Yes  Other Topics Concern  . None  Social History Narrative   Married for 60 year and wife died. Has four children (3 girls and 1 boy).   Married second time for one year.    Eats all foods.   Drive.   Wear seatbelt.   Retired for over 20 years.   Worked at [employer].     Review of Systems  Constitutional: Negative for fatigue, fever and unexpected weight change.  HENT: Negative for trouble swallowing.  Eyes: Negative for visual disturbance.  Respiratory: Negative for apnea, chest tightness and shortness of breath.   Cardiovascular: Negative for chest pain and palpitations.  Gastrointestinal: Negative for abdominal pain, diarrhea, nausea and vomiting.  Genitourinary: Negative for dysuria and urgency.  Musculoskeletal: Negative for myalgias.  Skin: Negative for rash.  Neurological: Negative for tremors, syncope, speech difficulty, numbness and headaches.  Psychiatric/Behavioral: Negative for agitation, behavioral problems, confusion, decreased concentration, dysphoric mood, hallucinations, sleep disturbance and suicidal ideas. The patient is not nervous/anxious.      Objective:   BP 138/60 (BP Location: Left Arm, Patient Position: Sitting, Cuff Size: Normal)   Pulse (!) 123   Temp 98 F (36.7 C) (Temporal)   Resp 16   Ht 5\' 10"   (1.778 m)   Wt 268 lb 4 oz (121.7 kg)   SpO2 95%   BMI 38.49 kg/m  Heart rate 92 Physical Exam  Constitutional: He is oriented to person, place, and time. He appears well-developed and well-nourished.  HENT:  Head: Normocephalic and atraumatic.  Eyes: Pupils are equal, round, and reactive to light. No scleral icterus.  Neck: Normal range of motion. Neck supple. No JVD present.  Cardiovascular: Normal rate, regular rhythm and normal heart sounds.  Pulmonary/Chest: Effort normal and breath sounds normal. No respiratory distress. He has no wheezes.  Neurological: He is alert and oriented to person, place, and time.  Skin: Skin is warm and dry.  Psychiatric: He has a normal mood and affect. His behavior is normal. Judgment and thought content normal.  Vitals reviewed.  Assessment and Plan  1. Depression, major, single episode, mild (HCC) Mood improved greatly with medication.  Continue Remeron nightly as directed.  Patient counseled in detail regarding the risks of medication. Told to call or return to clinic if develop any worrisome signs or symptoms. Patient voiced understanding.  Suicide risks evaluated and documented in note if present or in the area below.  Patient has protective factors of family and community support.  Patient reports that family believes is behaving rationally. Patient displays problem solving skills.   Patient specifically denies suicide ideation. Patient has access/information to healthcare contacts if situation or mood changes where patient is a risk to self or others or mood becomes unstable.   During the encounter, the patient had good eye contact and firm handshake regarding safety contract and agreement to seek help if mood worsens and not to harm self.   Patient understands the treatment plan and is in agreement. Agrees to keep follow up and call prior or return to clinic if needed.   - mirtazapine (REMERON) 15 MG tablet; Take 1 tablet (15 mg total) by  mouth at bedtime.  Dispense: 30 tablet; Refill: 3  2. HTN, goal below 130/80 Controlled.  Continue current medications.  Continue follow-up appointments with cardiology.  3. Hyperlipidemia associated with type 2 diabetes mellitus (HCC) To new current medications.  4. Restless leg syndrome Stable.  Continue medications nightly  5. Chronic obstructive pulmonary disease, unspecified COPD type (HCC) Patient was unable to afford Spiriva.  He will take the medication list with him to [place] on Thursday and get a prescription for this medication.  He reports that if written by [facility] doctor will only cost him $8.  He will continue his Symbicort.  Patient symptoms are improved but suspect that his breathing could be better with better control of COPD.  Return in about 4 weeks (around 01/09/2018) for Follow-up. Aliene Beams, MD 12/12/2017

## 2017-12-12 NOTE — Patient Instructions (Signed)
GET IMMUNIZATION RECORDS FROM [facility]/Dr. Lorelle FormosaHanna  Get a prescription from Dr. Lorelle FormosaHanna for symbiocort and spirivia and remeron

## 2017-12-15 ENCOUNTER — Ambulatory Visit: Payer: Medicare PPO | Admitting: Family Medicine

## 2017-12-23 ENCOUNTER — Telehealth: Payer: Self-pay

## 2017-12-23 DIAGNOSIS — F320 Major depressive disorder, single episode, mild: Secondary | ICD-10-CM

## 2017-12-23 MED ORDER — MIRTAZAPINE 15 MG PO TABS: 15.0000 mg | ORAL_TABLET | Freq: Every day | ORAL | 3 refills | Status: DC

## 2017-12-23 NOTE — Telephone Encounter (Signed)
Requesting rx refill onmirtazapine (REMERON) 15 MG tablet [[id number]][[id number]]  Today is any way possible.  Pt said he is out of med.

## 2017-12-23 NOTE — Telephone Encounter (Signed)
Refilled. Called patient to inform. Line rang twice and went busy

## 2018-01-24 ENCOUNTER — Telehealth: Payer: Self-pay | Admitting: Family Medicine

## 2018-01-24 NOTE — Telephone Encounter (Signed)
Mirtazapine--Can you call in something a little stronger. If you need to see him please let him know.

## 2018-01-24 NOTE — Telephone Encounter (Signed)
Please advise. Thank you   - S.  

## 2018-01-24 NOTE — Telephone Encounter (Signed)
Yes, he will need to follow up. At the last visit, the mirtazepine was working well. Please advise to schedule. Janine Limboachel H. Tracie HarrierHagler, MD

## 2018-01-24 NOTE — Telephone Encounter (Signed)
Spoke with patient and let him know of Dr.Hagler's recommendation with verbal understanding. He is scheduled for a follow up appt on May 7th and was advised to keep this appt. He stated that is a long time to be suffering. I told him I would send a message to the front end staff to see if they could possibly find a sooner appt and someone would call him either way. He verbalized understanding.

## 2018-02-14 ENCOUNTER — Ambulatory Visit: Payer: Medicare PPO | Admitting: Family Medicine

## 2018-02-14 ENCOUNTER — Other Ambulatory Visit: Payer: Self-pay

## 2018-02-14 ENCOUNTER — Encounter: Payer: Self-pay | Admitting: Family Medicine

## 2018-02-14 VITALS — BP 110/60 | HR 93 | Temp 97.6°F | Resp 12 | Ht 70.0 in | Wt 267.0 lb

## 2018-02-14 DIAGNOSIS — E1169 Type 2 diabetes mellitus with other specified complication: Secondary | ICD-10-CM

## 2018-02-14 DIAGNOSIS — F320 Major depressive disorder, single episode, mild: Secondary | ICD-10-CM

## 2018-02-14 DIAGNOSIS — F99 Mental disorder, not otherwise specified: Secondary | ICD-10-CM

## 2018-02-14 DIAGNOSIS — F5105 Insomnia due to other mental disorder: Secondary | ICD-10-CM | POA: Diagnosis not present

## 2018-02-14 MED ORDER — MIRTAZAPINE 30 MG PO TABS: 30.0000 mg | ORAL_TABLET | Freq: Every day | ORAL | 0 refills | Status: AC

## 2018-02-14 NOTE — Progress Notes (Signed)
Patient ID: Eddie Mitchell, male    DOB: 05-21-1934, 84 y.o.   MRN: [id number]  Chief Complaint  Patient presents with  . Depression  . Diabetes    type 2    Allergies Patient has no known allergies.  Subjective:   Eddie Mitchell is a 84 y.o. male who presents to [facility] today.  HPI Eddie Mitchell presents today for follow-up.  He has managed at the [facility] in [place], [place] for his diabetes, high blood pressure and COPD.  He comes in today to discuss his mood and his sleeping.  He reports that the Remeron seem to be working well but reports he does feel like he needs a higher dose.  Has been taking the medication at night.  He reports he does still feels stressed and somewhat down at times.  He reports that last week he cried a few times and became very emotional.  He reports that his energy is better than it was.  He would feels like his mood is better than it was several months ago but it does not feel completely back to normal.  He denies any suicidal or homicidal ideations.  He reports he does have trouble with excepting the fact that he is getting older.  He has had a history of cardiovascular disease including heart attack with stents placed.  He also has a history significant for COPD secondary to tobacco abuse.  He does no longer smoke.  He is remarried after his wife has passed away.  He reports he is happy in his relationship.  He reports that the Remeron made him feel better.  His wife reports that she did notice an improvement in his mood but believes that he still does need some additional help.  He did not have any side effects with the medication.  He has not had any falls.  His appetite is better.  He initially he was not wanting to get out of the house, visit friends, or even go out to dinner.  They have subsequently moved to a new house and he likes this area and the fact they do not have to climb his many stairs.  He is continuing to follow-up with cardiology.  He has  upcoming surgery for gallbladder removal scheduled at the [facility] hospital.  He has diabetes and cholesterol issues are managed at the [facility] clinic.  I have requested the labs but do not have them.   Past Medical History:  Diagnosis Date  . Alcohol abuse   . AMI (acute myocardial infarction) (HCC)    s/p 2 stents. Dr. Mervin Hack  . Arthritis   . Cardiovascular disease   . CHF (congestive heart failure) (HCC)   . COPD (chronic obstructive pulmonary disease) (HCC)   . Hypertension   . Tobacco abuse    quit in 1992; smoked cigarettes for 50 years/ Marlboro Lights, 1/2 ppd or less    Past Surgical History:  Procedure Laterality Date  . APPENDECTOMY    . BACK SURGERY      No family history on file.   Social History   Socioeconomic History  . Marital status: Married    Spouse name: Not on file  . Number of children: Not on file  . Years of education: Not on file  . Highest education level: Not on file  Occupational History  . Not on file  Social Needs  . Financial resource strain: Not on file  . Food insecurity:    Worry:  Not on file    Inability: Not on file  . Transportation needs:    Medical: Not on file    Non-medical: Not on file  Tobacco Use  . Smoking status: Former Games developer  . Smokeless tobacco: Never Used  Substance and Sexual Activity  . Alcohol use: No  . Drug use: No  . Sexual activity: Yes  Lifestyle  . Physical activity:    Days per week: Not on file    Minutes per session: Not on file  . Stress: Not on file  Relationships  . Social connections:    Talks on phone: Not on file    Gets together: Not on file    Attends religious service: Not on file    Active member of club or organization: Not on file    Attends meetings of clubs or organizations: Not on file    Relationship status: Not on file  Other Topics Concern  . Not on file  Social History Narrative   Married for 60 year and wife died. Has four children (3 girls and 1 boy).   Married second time for  one year.    Eats all foods.   Drive.   Wear seatbelt.   Retired for over 20 years.   Worked at [employer].    Current Outpatient Medications on File Prior to Visit  Medication Sig Dispense Refill  . albuterol (PROVENTIL) (5 MG/ML) 0.5% nebulizer solution Take 2.5 mg by nebulization every 4 (four) hours as needed for wheezing or shortness of breath.    Marland Kitchen ammonium lactate (LAC-HYDRIN) 12 % lotion Apply 1 application topically daily.    Marland Kitchen apixaban (ELIQUIS) 5 MG TABS tablet Eliquis 5 mg tablet  Take 1 tablet twice a day by oral route for 90 days.    Marland Kitchen aspirin EC 81 MG tablet Take 81 mg by mouth daily.    Marland Kitchen atorvastatin (LIPITOR) 40 MG tablet Take 40 mg by mouth daily.    . budesonide-formoterol (SYMBICORT) 160-4.5 MCG/ACT inhaler Inhale into the lungs.    . bumetanide (BUMEX) 0.5 MG tablet Take 0.5 mg by mouth daily.    Marland Kitchen desonide (DESOWEN) 0.05 % cream Apply 1 application topically 2 (two) times daily.    Marland Kitchen diltiazem (CARTIA XT) 180 MG 24 hr capsule diltiazem CD 180 mg capsule,extended release 24 hr  Take 1 capsule every day by oral route for 90 days.    . fluoruracil (CARAC) 0.5 % cream Apply 1 application topically daily. Up to the sleeve line for 2 weeks. Avoid sun exposure    . furosemide (LASIX) 20 MG tablet Take 20 mg by mouth.    Marland Kitchen ipratropium-albuterol (DUONEB) 0.5-2.5 (3) MG/3ML SOLN Take 3 mLs by nebulization.    Marland Kitchen lisinopril (PRINIVIL,ZESTRIL) 10 MG tablet Take 10 mg by mouth daily.    . metoprolol tartrate (LOPRESSOR) 25 MG tablet Take 25 mg by mouth 2 (two) times daily.    . nitroGLYCERIN (NITROSTAT) 0.4 MG SL tablet Place 0.4 mg under the tongue every 5 (five) minutes as needed for chest pain.    . pramipexole (MIRAPEX) 1 MG tablet Take 1 mg by mouth at bedtime.    . ranolazine (RANEXA) 500 MG 12 hr tablet Take 500 mg by mouth 2 (two) times daily.    . tamsulosin (FLOMAX) 0.4 MG CAPS capsule Take 0.8 mg by mouth daily.    Marland Kitchen tiotropium (SPIRIVA HANDIHALER) 18 MCG inhalation  capsule Place 1 capsule (18 mcg total) into inhaler and inhale daily  at 6 (six) AM. 30 capsule 12  . Travoprost, BAK Free, (TRAVATAN Z) 0.004 % SOLN ophthalmic solution Apply 1 drop to eye every evening.    . metolazone (ZAROXOLYN) 2.5 MG tablet metolazone 2.5 mg tablet  Take 1 tablet every day by oral route.    . pramipexole (MIRAPEX) 0.5 MG tablet Take 2 tablets (1 mg total) by mouth at bedtime. (Patient not taking: Reported on 02/14/2018) 180 tablet 1   No current facility-administered medications on file prior to visit.     Review of Systems  Constitutional: Negative for appetite change, chills, fever and unexpected weight change.  HENT: Negative for trouble swallowing and voice change.   Eyes: Negative for visual disturbance.  Respiratory: Negative for cough, chest tightness, shortness of breath and wheezing.   Cardiovascular: Negative for chest pain, palpitations and leg swelling.  Gastrointestinal: Negative for abdominal pain, diarrhea, nausea and vomiting.  Genitourinary: Negative for decreased urine volume, dysuria and frequency.  Skin: Negative for rash.  Neurological: Negative for dizziness, tremors, syncope, facial asymmetry, weakness and headaches.  Hematological: Negative for adenopathy. Does not bruise/bleed easily.  Psychiatric/Behavioral: Positive for dysphoric mood and sleep disturbance. Negative for agitation, behavioral problems, decreased concentration, hallucinations and suicidal ideas. The patient is nervous/anxious. The patient is not hyperactive.      Objective:   BP 110/60 (BP Location: Left Arm, Patient Position: Sitting, Cuff Size: Large)   Pulse 93   Temp 97.6 F (36.4 C) (Oral)   Resp 12   Ht  (1.778 m)   Wt 267 lb 0.6 oz (121.1 kg)   SpO2 97%   BMI 38.32 kg/m   Physical Exam  Constitutional: He is oriented to person, place, and time. He appears well-developed and well-nourished.  HENT:  Head: Normocephalic and atraumatic.  Eyes: Pupils are  equal, round, and reactive to light. EOM are normal.  Neck: Normal range of motion. Neck supple. No thyromegaly present.  Cardiovascular: Normal rate, regular rhythm and normal heart sounds.  Pulmonary/Chest: Effort normal and breath sounds normal.  Musculoskeletal: He exhibits no edema.  Neurological: He is alert and oriented to person, place, and time. No cranial nerve deficit.  Skin: Skin is warm, dry and intact.  Psychiatric: His behavior is normal. Judgment and thought content normal. His mood appears anxious. Cognition and memory are normal.  Mood slightly dysthymic.  Affect congruent with mood.  Speech normal rate, rhythm, and prosody.  Behavior within normal limits.  Thought content goal directed without delusions, phobias, obsessions, or compulsions.  Vitals reviewed.  Depression screen [facility] 2/9 02/14/2018 02/14/2018 10/28/2017  Decreased Interest 1 0 0  Down, Depressed, Hopeless 1 0 0  PHQ - 2 Score 2 0 0  Altered sleeping 2 - -  Change in appetite 1 - -  Feeling bad or failure about yourself  1 - -  Trouble concentrating 1 - -  Suicidal thoughts 1 - -  PHQ-9 Score 8 - -  Difficult doing work/chores Somewhat difficult - -     Assessment and Plan  1. Depression, major, single episode, mild (HCC) Symptoms improved on mirtazapine 15 mg p.o. nightly.  However, not at goal.  Still struggling some with mood.  Increase dosing to 30 mg p.o. Nightly. Told to call with any questions or concerns. Risks versus benefits of medication discussed. Suicide risks evaluated and documented in note if present or in the area below.  Patient has protective factors of family and community support.  Patient reports that family believes  is behaving rationally. Patient displays problem solving skills.   Patient specifically denies suicide ideation. Patient has access/information to healthcare contacts if situation or mood changes where patient is a risk to self or others or mood becomes unstable.    During the encounter, the patient had good eye contact and firm handshake regarding safety contract and agreement to seek help if mood worsens and not to harm self.   Patient understands the treatment plan and is in agreement. Agrees to keep follow up and call prior or return to clinic if needed.   - mirtazapine (REMERON) 30 MG tablet; Take 1 tablet (30 mg total) by mouth at bedtime.  Dispense: 30 tablet; Refill: 0  2. Insomnia due to other mental disorder Sleep hygiene discussed with patient. - mirtazapine (REMERON) 30 MG tablet; Take 1 tablet (30 mg total) by mouth at bedtime.  Dispense: 30 tablet; Refill: 0  3. Type 2 diabetes mellitus with other specified complication, unspecified whether long term insulin use (HCC) Discussed with patient that it is difficult to have him being seen routinely at the [facility] clinic and then managing his medical issues and then him also coming to our office.  I have tried several times to get his labs but have not been successful.  He still wishes to come to our office and be seen.  However, he wants his blood work and medications for the most part to come from the [place] due to the fact that he does not incur costs.  He defers foot exam today because he just had that at the [place].  He does report he has not had his eye exam performed.  Management for his diabetes, per his request is deferred to the [facility] clinic unless he switches his care to our office. - Ambulatory referral to Ophthalmology  Patient reportedly has surgical clearance by his cardiologist, Dr. Catha Gosselin, for his upcoming surgery.  His surgery is scheduled for approximately 1 week from today.  His wife will call and let us know how he is doing.  They were told to call with any questions or concerns.  His wife reports that she will have his immunization records, labs, and office visit sent to our office for review.  He will follow-up in 1 month or sooner if needed. Return in about 1 month (around 03/14/2018) for  follow up. Aliene Beams, MD 02/14/2018

## 2018-03-15 ENCOUNTER — Ambulatory Visit: Payer: Medicare PPO | Admitting: Family Medicine

## 2018-03-16 ENCOUNTER — Encounter: Payer: Self-pay | Admitting: Family Medicine

## 2018-03-17 ENCOUNTER — Encounter: Payer: Self-pay | Admitting: Family Medicine

## 2018-04-21 ENCOUNTER — Ambulatory Visit: Payer: Medicare PPO | Admitting: Podiatry

## 2018-04-28 ENCOUNTER — Other Ambulatory Visit: Payer: Self-pay

## 2018-04-28 ENCOUNTER — Telehealth: Payer: Self-pay | Admitting: Family Medicine

## 2018-04-28 MED ORDER — PRAMIPEXOLE DIHYDROCHLORIDE 1 MG PO TABS: 1.0000 mg | ORAL_TABLET | Freq: Every day | ORAL | 0 refills | Status: DC

## 2018-04-28 NOTE — Telephone Encounter (Signed)
Pt is calling needing a refill on the medicine--pramipexole  1mg --[facility]

## 2018-04-28 NOTE — Telephone Encounter (Signed)
Medication sent in. 

## 2019-08-07 ENCOUNTER — Other Ambulatory Visit: Payer: Self-pay

## 2019-08-07 DIAGNOSIS — Z20822 Contact with and (suspected) exposure to covid-19: Secondary | ICD-10-CM

## 2019-08-09 LAB — NOVEL CORONAVIRUS, NAA: SARS-CoV-2, NAA: NOT DETECTED

## 2019-08-10 ENCOUNTER — Telehealth: Payer: Self-pay | Admitting: General Practice

## 2019-08-10 NOTE — Telephone Encounter (Signed)
Negative COVID results given. Patient results "NOT Detected." Caller expressed understanding. ° °

## 2019-12-26 ENCOUNTER — Other Ambulatory Visit: Payer: Self-pay | Admitting: Podiatry

## 2019-12-26 ENCOUNTER — Ambulatory Visit: Payer: Medicare PPO | Admitting: Podiatry

## 2019-12-26 ENCOUNTER — Other Ambulatory Visit: Payer: Self-pay

## 2019-12-26 ENCOUNTER — Ambulatory Visit (INDEPENDENT_AMBULATORY_CARE_PROVIDER_SITE_OTHER): Payer: Medicare PPO

## 2019-12-26 VITALS — Temp 98.2°F

## 2019-12-26 DIAGNOSIS — L97522 Non-pressure chronic ulcer of other part of left foot with fat layer exposed: Secondary | ICD-10-CM | POA: Diagnosis not present

## 2019-12-26 DIAGNOSIS — E0843 Diabetes mellitus due to underlying condition with diabetic autonomic (poly)neuropathy: Secondary | ICD-10-CM | POA: Diagnosis not present

## 2019-12-26 DIAGNOSIS — M79672 Pain in left foot: Secondary | ICD-10-CM

## 2019-12-26 DIAGNOSIS — I509 Heart failure, unspecified: Secondary | ICD-10-CM | POA: Insufficient documentation

## 2019-12-26 MED ORDER — TRAMADOL HCL 50 MG PO TABS: 50.0000 mg | ORAL_TABLET | Freq: Four times a day (QID) | ORAL | 0 refills | Status: DC | PRN

## 2019-12-26 MED ORDER — DOXYCYCLINE HYCLATE 100 MG PO TABS: 100.0000 mg | ORAL_TABLET | Freq: Two times a day (BID) | ORAL | 0 refills | Status: DC

## 2019-12-26 MED ORDER — GENTAMICIN SULFATE 0.1 % EX CREA: 1.0000 "application " | TOPICAL_CREAM | Freq: Two times a day (BID) | CUTANEOUS | 0 refills | Status: AC

## 2019-12-30 NOTE — Progress Notes (Signed)
   Subjective:  86 y.o. male with PMHx of diabetes mellitus presenting today as a new patient with a chief complaint of an ulceration of the left plantar forefoot that appeared about two weeks ago. He notes severe pain that is exacerbated by standing. He also reports some associated numbness bilaterally. He has not done anything for treatment. Patient is here for further evaluation and treatment.   Past Medical History:  Diagnosis Date  . Alcohol abuse   . AMI (acute myocardial infarction) (HCC)    s/p 2 stents. Dr. Mervin Hack  . Arthritis   . Cardiovascular disease   . CHF (congestive heart failure) (HCC)   . COPD (chronic obstructive pulmonary disease) (HCC)   . Hypertension   . Tobacco abuse    quit in 1992; smoked cigarettes for 50 years/ Marlboro Lights, 1/2 ppd or less      Objective/Physical Exam General: The patient is alert and oriented x3 in no acute distress.  Dermatology:  Wound #1 noted to the left forefoot measuring approximately 3.5 x 2.5 x 0.3 cm (LxWxD).   To the noted ulceration(s), there is no eschar. There is a moderate amount of slough, fibrin, and necrotic tissue noted. Granulation tissue and wound base is red. There is a minimal amount of serosanguineous drainage noted. There is no exposed bone muscle-tendon ligament or joint. There is no malodor. Periwound integrity is intact. Skin is warm, dry and supple bilateral lower extremities.  Vascular: Palpable pedal pulses bilaterally. No edema or erythema noted. Capillary refill within normal limits.  Neurological: Epicritic and protective threshold diminished bilaterally.   Musculoskeletal Exam: Range of motion within normal limits to all pedal and ankle joints bilateral. Muscle strength 5/5 in all groups bilateral.  Radiographic Exam:  Normal osseous mineralization. Joint spaces preserved. No fracture/dislocation/boney destruction.     Assessment: 1. Ulceration of the left forefoot secondary to diabetes  mellitus 2. diabetes mellitus w/ peripheral neuropathy   Plan of Care:  1. Patient was evaluated. 2. medically necessary excisional debridement including subcutaneous tissue was performed using a tissue nipper and a chisel blade. Excisional debridement of all the necrotic nonviable tissue down to healthy bleeding viable tissue was performed with post-debridement measurements same as pre-. 3. the wound was cleansed and dry sterile dressing applied. 4. Culture taken from wound.  5. Post op shoe dispensed.  6. Prescription for Doxycycline 100 mg #20 provided to patient.  7. Prescription for Gentamicin cream provided to patient to use daily with a bandage.  8. Prescription for Vicodin 5/325 mg #20 provided to patient.  9. patient is to return to clinic in 2 weeks.   Felecia Shelling, DPM [facility]  Dr. Felecia Shelling, DPM    [address]                                        [place], [place] [address]                Office [phone]  Fax [phone]

## 2019-12-31 LAB — WOUND CULTURE
MICRO NUMBER:: 10261781
SPECIMEN QUALITY:: ADEQUATE

## 2019-12-31 LAB — HOUSE ACCOUNT TRACKING

## 2020-01-09 ENCOUNTER — Other Ambulatory Visit: Payer: Self-pay

## 2020-01-09 ENCOUNTER — Ambulatory Visit: Payer: Medicare PPO | Admitting: Podiatry

## 2020-01-09 DIAGNOSIS — L97522 Non-pressure chronic ulcer of other part of left foot with fat layer exposed: Secondary | ICD-10-CM | POA: Diagnosis not present

## 2020-01-09 DIAGNOSIS — E0843 Diabetes mellitus due to underlying condition with diabetic autonomic (poly)neuropathy: Secondary | ICD-10-CM | POA: Diagnosis not present

## 2020-01-09 DIAGNOSIS — R600 Localized edema: Secondary | ICD-10-CM

## 2020-01-09 MED ORDER — SULFAMETHOXAZOLE-TRIMETHOPRIM 800-160 MG PO TABS: 1.0000 | ORAL_TABLET | Freq: Two times a day (BID) | ORAL | 0 refills | Status: AC

## 2020-01-16 ENCOUNTER — Other Ambulatory Visit: Payer: Self-pay

## 2020-01-16 ENCOUNTER — Ambulatory Visit: Payer: Medicare PPO | Admitting: Podiatry

## 2020-01-16 DIAGNOSIS — R600 Localized edema: Secondary | ICD-10-CM

## 2020-01-16 DIAGNOSIS — E0843 Diabetes mellitus due to underlying condition with diabetic autonomic (poly)neuropathy: Secondary | ICD-10-CM

## 2020-01-16 DIAGNOSIS — L97522 Non-pressure chronic ulcer of other part of left foot with fat layer exposed: Secondary | ICD-10-CM

## 2020-01-16 NOTE — Progress Notes (Signed)
   Subjective:  86 y.o. male with PMHx of diabetes mellitus presenting today for follow up evaluation of an ulceration of the left forefoot. She reports improvement. She has finished the course of Doxycycline as directed. She has been keeping the wound clean and applying Gentamicin cream as directed. She continues to use the post op shoe as directed. There are no worsening factors noted. Patient is here for further evaluation and treatment.   Past Medical History:  Diagnosis Date  . Alcohol abuse   . AMI (acute myocardial infarction) (HCC)    s/p 2 stents. Dr. Mervin Hack  . Arthritis   . Cardiovascular disease   . CHF (congestive heart failure) (HCC)   . COPD (chronic obstructive pulmonary disease) (HCC)   . Hypertension   . Tobacco abuse    quit in 1992; smoked cigarettes for 50 years/ Marlboro Lights, 1/2 ppd or less      Objective/Physical Exam General: The patient is alert and oriented x3 in no acute distress.  Dermatology:  Wound #1 noted to the left forefoot measuring approximately 1.0 x 1.0 x 0.3 cm (LxWxD).   To the noted ulceration(s), there is no eschar. There is a moderate amount of slough, fibrin, and necrotic tissue noted. Granulation tissue and wound base is red. There is a minimal amount of serosanguineous drainage noted. There is no exposed bone muscle-tendon ligament or joint. There is no malodor. Periwound integrity is intact. Skin is warm, dry and supple bilateral lower extremities. Erythema and edema noted to the left lower extremity consistent with cellulitis.   Vascular: Edema noted to the left lower extremity. Palpable pedal pulses bilaterally. Capillary refill within normal limits.  Neurological: Epicritic and protective threshold diminished bilaterally.   Musculoskeletal Exam: Range of motion within normal limits to all pedal and ankle joints bilateral. Muscle strength 5/5 in all groups bilateral.  Assessment: 1. Ulceration of the left forefoot secondary to  diabetes mellitus 2. diabetes mellitus w/ peripheral neuropathy 3. Edema LLE 4. Left leg cellulitis    Plan of Care:  1. Patient was evaluated. 2. medically necessary excisional debridement including subcutaneous tissue was performed using a tissue nipper and a chisel blade. Excisional debridement of all the necrotic nonviable tissue down to healthy bleeding viable tissue was performed with post-debridement measurements same as pre-. 3. the wound was cleansed and dry sterile dressing applied. 4. Continue using Gentamicin cream.  5. Unna boot applied.  6. Continue using post op shoe.  7. Prescription for Bactrim DS #20 provided to patient.  8. Return to clinic in one week.    Felecia Shelling, DPM [facility]  Dr. Felecia Shelling, DPM    [address]                                        [place], [place] [address]                Office [phone]  Fax [phone]

## 2020-01-18 NOTE — Progress Notes (Signed)
   Subjective:  86 y.o. male with PMHx of diabetes mellitus presenting today for follow up evaluation of an ulceration of the left forefoot, cellulitis of the left leg and edema of the left ankle. He states he is doing well and the wound has improved. He states the swelling is better as well. He has taken a course of antibiotics. There are no modifying factors noted. Patient is here for further evaluation and treatment.   Past Medical History:  Diagnosis Date  . Alcohol abuse   . AMI (acute myocardial infarction) (HCC)    s/p 2 stents. Dr. Mervin Hack  . Arthritis   . Cardiovascular disease   . CHF (congestive heart failure) (HCC)   . COPD (chronic obstructive pulmonary disease) (HCC)   . Hypertension   . Tobacco abuse    quit in 1992; smoked cigarettes for 50 years/ Marlboro Lights, 1/2 ppd or less      Objective/Physical Exam General: The patient is alert and oriented x3 in no acute distress.  Dermatology:  Wound noted to the left forefoot has healed. Complete re-epithelialization has occurred. No drainage noted.   Vascular: No edema or erythema noted. Palpable pedal pulses bilaterally. Capillary refill within normal limits.  Neurological: Epicritic and protective threshold diminished bilaterally.   Musculoskeletal Exam: Range of motion within normal limits to all pedal and ankle joints bilateral. Muscle strength 5/5 in all groups bilateral.  Assessment: 1. Ulceration of the left forefoot secondary to diabetes mellitus - healed  2. diabetes mellitus w/ peripheral neuropathy 3. Edema LLE - resolved  4. Left leg cellulitis - resolved    Plan of Care:  1. Patient was evaluated. 2. Discontinue using post op shoe.  3. Resume wearing DM shoes.  4. Return to clinic in 3 months for routine care.     Felecia Shelling, DPM [facility]  Dr. Felecia Shelling, DPM    [address]                                        [place], [place] [address]                Office  [phone]  Fax [phone]

## 2020-02-20 ENCOUNTER — Ambulatory Visit: Payer: Medicare PPO | Admitting: Podiatry

## 2020-03-17 ENCOUNTER — Telehealth: Payer: Self-pay | Admitting: *Deleted

## 2020-03-17 NOTE — Telephone Encounter (Signed)
Left message for pt or wife to call to discuss foot status and the possibility of a earlier appt.

## 2020-03-17 NOTE — Telephone Encounter (Signed)
Left message requesting more information concerning his left foot.

## 2020-03-17 NOTE — Telephone Encounter (Signed)
Pt's wife, Maralyn Sago left message stating pt needed to speak with someone concerning his left foot, next appt 04/16/2020.

## 2020-04-07 ENCOUNTER — Ambulatory Visit: Payer: Medicare PPO | Admitting: Podiatry

## 2020-04-16 ENCOUNTER — Ambulatory Visit: Payer: Medicare PPO | Admitting: Podiatry

## 2020-04-29 ENCOUNTER — Ambulatory Visit: Payer: Medicare PPO | Admitting: Podiatry

## 2020-04-29 ENCOUNTER — Other Ambulatory Visit: Payer: Self-pay

## 2020-04-29 ENCOUNTER — Encounter: Payer: Self-pay | Admitting: Podiatry

## 2020-04-29 ENCOUNTER — Other Ambulatory Visit: Payer: Self-pay | Admitting: Podiatry

## 2020-04-29 DIAGNOSIS — L97522 Non-pressure chronic ulcer of other part of left foot with fat layer exposed: Secondary | ICD-10-CM

## 2020-04-29 DIAGNOSIS — E0843 Diabetes mellitus due to underlying condition with diabetic autonomic (poly)neuropathy: Secondary | ICD-10-CM

## 2020-04-29 DIAGNOSIS — L03116 Cellulitis of left lower limb: Secondary | ICD-10-CM

## 2020-04-29 DIAGNOSIS — R600 Localized edema: Secondary | ICD-10-CM

## 2020-04-29 MED ORDER — DOXYCYCLINE HYCLATE 100 MG PO TABS: 100.0000 mg | ORAL_TABLET | Freq: Two times a day (BID) | ORAL | 1 refills | Status: AC

## 2020-04-29 MED ORDER — PRAMIPEXOLE DIHYDROCHLORIDE 1 MG PO TABS: 1.0000 mg | ORAL_TABLET | Freq: Every day | ORAL | 1 refills | Status: AC

## 2020-04-29 NOTE — Addendum Note (Signed)
Addended by: Geraldine Contras D on: 04/29/2020 04:32 PM   Modules accepted: Orders

## 2020-04-29 NOTE — Addendum Note (Signed)
Addended by: Felecia Shelling on: 04/29/2020 04:25 PM   Modules accepted: Orders

## 2020-04-29 NOTE — Progress Notes (Addendum)
Subjective:  86 y.o. male with PMHx of diabetes mellitus presenting today for recurrence of an ulcer that is developed to the plantar aspect of the left forefoot.  Patient states that since last visit on 01/16/2020 the pain has recurred and he slowly developed another ulcer.  His wife has been applying Silvadene cream and a light dressing daily to the area.  He presents today for further treatment and evaluation Patient also states that he has very restless legs and feet that keep him up all night.  He is taking Mirapex in the past but currently does not have a new prescription.  [facility]  The restless legs and feet keep him up all night long and is unable to sleep.  He is requesting a refill prescription of Mirapex sent to the pharmacy  Past Medical History:  Diagnosis Date  . Alcohol abuse   . AMI (acute myocardial infarction) (HCC)    s/p 2 stents. Dr. Mervin Hack  . Arthritis   . Cardiovascular disease   . CHF (congestive heart failure) (HCC)   . COPD (chronic obstructive pulmonary disease) (HCC)   . Hypertension   . Tobacco abuse    quit in 1992; smoked cigarettes for 50 years/ Marlboro Lights, 1/2 ppd or less      Objective/Physical Exam General: The patient is alert and oriented x3 in no acute distress.  Dermatology:  Wound #1 noted to the plantar aspect of the left forefoot measuring 2.5 x 1.5 x 0.3 cm (LxWxD).   To the noted ulceration(s), there is no eschar. There is a moderate amount of slough, fibrin, and necrotic tissue noted. Granulation tissue and wound base is red. There is a minimal amount of serosanguineous drainage noted. There is no exposed bone muscle-tendon ligament or joint. There is no malodor. Periwound integrity is intact. Skin is warm, dry and supple bilateral lower extremities.  Vascular: Palpable pedal pulses bilaterally.  Edema noted left lower extremity with some mild erythema consistent and concerning for  cellulitis of the left leg.  Capillary refill within normal limits.  Neurological: Epicritic and protective threshold diminished bilaterally.   Musculoskeletal Exam: Range of motion within normal limits to all pedal and ankle joints bilateral. Muscle strength 5/5 in all groups bilateral.   Assessment: 1.  Ulcer left foot secondary to diabetes mellitus 2. diabetes mellitus w/ peripheral neuropathy   Plan of Care:  1. Patient was evaluated. 2. medically necessary excisional debridement including subcutaneous tissue was performed using a tissue nipper and a chisel blade. Excisional debridement of all the necrotic nonviable tissue down to healthy bleeding viable tissue was performed with post-debridement measurements same as pre-. 3. the wound was cleansed and dry sterile dressing applied. 4.  Cultures were taken and sent to pathology for culture and sensitivity  5.  Prescription for doxycycline 100 mg 2 times daily x10 days  6.  Postoperative shoe dispensed.  Weightbearing as tolerated  7.  Prescription for Mirapex 1.0 mg nightly #31 refill for restless legs and feet  8.  Patient is to return to clinic in 2 weeks.   Felecia Shelling, DPM [facility]  Dr. Felecia Shelling, DPM    [address]                                        [place],  [place] [address]                Office [phone]  Fax [phone]

## 2020-05-02 LAB — WOUND CULTURE: Organism ID, Bacteria: NONE SEEN

## 2020-05-19 ENCOUNTER — Other Ambulatory Visit: Payer: Self-pay

## 2020-05-19 ENCOUNTER — Ambulatory Visit: Payer: Medicare PPO | Admitting: Podiatry

## 2020-05-19 DIAGNOSIS — R600 Localized edema: Secondary | ICD-10-CM | POA: Diagnosis not present

## 2020-05-19 DIAGNOSIS — L97522 Non-pressure chronic ulcer of other part of left foot with fat layer exposed: Secondary | ICD-10-CM | POA: Diagnosis not present

## 2020-05-19 DIAGNOSIS — E0843 Diabetes mellitus due to underlying condition with diabetic autonomic (poly)neuropathy: Secondary | ICD-10-CM | POA: Diagnosis not present

## 2020-05-19 NOTE — Progress Notes (Signed)
   Subjective:  86 y.o. male with PMHx of diabetes mellitus presenting today for recurrence of an ulcer that is developed to the plantar aspect of the left forefoot.  Patient states that since last visit on 01/16/2020 the pain has recurred and he slowly developed another ulcer.  Patient states that since last visit he has done significantly better.  He no longer has the pain to his forefoot.  His wife has been applying the antibiotic ointment and a light dressing daily.  No new complaints at this time He also states that the Mirapex is helping significantly.  He normally gets his Mirapex from the [place]. Past Medical History:  Diagnosis Date  . Alcohol abuse   . AMI (acute myocardial infarction) (HCC)    s/p 2 stents. Dr. Mervin Hack  . Arthritis   . Cardiovascular disease   . CHF (congestive heart failure) (HCC)   . COPD (chronic obstructive pulmonary disease) (HCC)   . Hypertension   . Tobacco abuse    quit in 1992; smoked cigarettes for 50 years/ Marlboro Lights, 1/2 ppd or less      Objective/Physical Exam General: The patient is alert and oriented x3 in no acute distress.  Dermatology:  Wound #1 noted to the plantar aspect of the left forefoot has healed.  Complete reepithelialization has occurred.  There is some hyperkeratotic tissue around the freshly healed ulcer site.   Vascular: Palpable pedal pulses bilaterally.  Edema noted left lower extremity with some mild erythema consistent and concerning for cellulitis of the left leg.  Capillary refill within normal limits.  Neurological: Epicritic and protective threshold diminished bilaterally.   Musculoskeletal Exam: Range of motion within normal limits to all pedal and ankle joints bilateral. Muscle strength 5/5 in all groups bilateral.   Assessment: 1.  Ulcer left foot secondary to diabetes mellitus 2. diabetes mellitus w/ peripheral neuropathy   Plan of Care:  1. Patient was evaluated. 2.  Light debridement was performed of the  hyperkeratotic tissue around the freshly healed ulcer site.  No bleeding noted.   3. the wound was cleansed and dry sterile dressing applied. 4.  Continue diabetic shoes 5.  Return to clinic in 3 months for routine foot care  Felecia Shelling, DPM [facility]  Dr. Felecia Shelling, DPM    [address]                                        [place], [place] [address]                Office [phone]  Fax [phone]

## 2020-06-11 ENCOUNTER — Other Ambulatory Visit: Payer: Self-pay | Admitting: Podiatry

## 2020-06-11 ENCOUNTER — Telehealth (INDEPENDENT_AMBULATORY_CARE_PROVIDER_SITE_OTHER): Payer: Medicare PPO | Admitting: Podiatry

## 2020-06-11 MED ORDER — TRAMADOL HCL 50 MG PO TABS: 50.0000 mg | ORAL_TABLET | Freq: Four times a day (QID) | ORAL | 0 refills | Status: DC | PRN

## 2020-06-11 NOTE — Progress Notes (Signed)
Dr. Logan Bores approved refill of Tramadol for Mr. Eddie Mitchell. He has taken it in the past with no problem.

## 2020-06-11 NOTE — Telephone Encounter (Signed)
Patient called stating both feet are painful. Has no open wounds. States he cannot put pressure on them. Does not want to come into the office, but would like something for pain. No fever, chills, night sweats, nausea or vomiting. Dr. Logan Bores approved refill of Tramadol.

## 2020-07-15 ENCOUNTER — Other Ambulatory Visit: Payer: Self-pay | Admitting: Sports Medicine

## 2020-07-15 ENCOUNTER — Telehealth: Payer: Self-pay | Admitting: Podiatry

## 2020-07-15 MED ORDER — TRAMADOL HCL 50 MG PO TABS: 50.0000 mg | ORAL_TABLET | Freq: Four times a day (QID) | ORAL | 0 refills | Status: AC | PRN

## 2020-07-15 NOTE — Telephone Encounter (Signed)
Patient having bilateral foot pain; would like refill of pain medication. Wife's call back number is [phone].

## 2020-07-15 NOTE — Progress Notes (Signed)
Refilled tramadol for pain -Dr. Kathie Rhodes

## 2020-07-15 NOTE — Telephone Encounter (Signed)
Refilled Tramadol

## 2020-08-18 ENCOUNTER — Ambulatory Visit (INDEPENDENT_AMBULATORY_CARE_PROVIDER_SITE_OTHER): Payer: Medicare PPO | Admitting: Podiatry

## 2020-08-18 ENCOUNTER — Other Ambulatory Visit: Payer: Self-pay

## 2020-08-18 DIAGNOSIS — M79675 Pain in left toe(s): Secondary | ICD-10-CM

## 2020-08-18 DIAGNOSIS — L989 Disorder of the skin and subcutaneous tissue, unspecified: Secondary | ICD-10-CM

## 2020-08-18 DIAGNOSIS — E0843 Diabetes mellitus due to underlying condition with diabetic autonomic (poly)neuropathy: Secondary | ICD-10-CM

## 2020-08-18 DIAGNOSIS — B351 Tinea unguium: Secondary | ICD-10-CM | POA: Diagnosis not present

## 2020-08-18 DIAGNOSIS — M79674 Pain in right toe(s): Secondary | ICD-10-CM | POA: Diagnosis not present

## 2020-08-20 ENCOUNTER — Ambulatory Visit: Payer: Medicare PPO | Admitting: Podiatry

## 2020-08-27 NOTE — Progress Notes (Signed)
   SUBJECTIVE Patient with a history of diabetes mellitus presents to office today complaining of elongated, thickened nails that cause pain while ambulating in shoes.  He is unable to trim his own nails. Patient is here for further evaluation and treatment.   Past Medical History:  Diagnosis Date  . Alcohol abuse   . AMI (acute myocardial infarction) (HCC)    s/p 2 stents. Dr. Mervin Hack  . Arthritis   . Cardiovascular disease   . CHF (congestive heart failure) (HCC)   . COPD (chronic obstructive pulmonary disease) (HCC)   . Hypertension   . Tobacco abuse    quit in 1992; smoked cigarettes for 50 years/ Marlboro Lights, 1/2 ppd or less    OBJECTIVE General Patient is awake, alert, and oriented x 3 and in no acute distress. Derm Skin is dry and supple bilateral. Negative open lesions or macerations. Remaining integument unremarkable. Nails are tender, long, thickened and dystrophic with subungual debris, consistent with onychomycosis, 1-5 bilateral. No signs of infection noted.  Hyperkeratotic preulcerative callus tissue also noted to the bilateral feet Vasc  DP and PT pedal pulses palpable bilaterally. Temperature gradient within normal limits.  Neuro Epicritic and protective threshold sensation diminished bilaterally.  Musculoskeletal Exam No symptomatic pedal deformities noted bilateral. Muscular strength within normal limits.  ASSESSMENT 1. Diabetes Mellitus w/ peripheral neuropathy 2. Onychomycosis of nail due to dermatophyte bilateral 3.  Preulcerative callus tissue bilateral feet  PLAN OF CARE 1. Patient evaluated today. 2. Instructed to maintain good pedal hygiene and foot care. Stressed importance of controlling blood sugar.  3. Mechanical debridement of nails 1-5 bilaterally performed using a nail nipper. Filed with dremel without incident.  4.  Excisional debridement of the hyperkeratotic callus tissue was performed using a tissue nipper without incident or bleeding  5.   Return to clinic in 3 mos.     Felecia Shelling, DPM [facility]  Dr. Felecia Shelling, DPM    [address]                                        [place], [place] [address]                Office [phone]  Fax [phone]

## 2020-11-17 ENCOUNTER — Ambulatory Visit: Payer: Medicare PPO | Admitting: Podiatry

## 2021-01-09 DEATH — deceased
